# Patient Record
Sex: Female | Born: 1970 | Race: Black or African American | Hispanic: No | Marital: Single | Smoking: Current every day smoker
Health system: Southern US, Community
[De-identification: ages and names within clinical notes are randomized; demographics above are authoritative.]

## PROBLEM LIST (undated history)

## (undated) DIAGNOSIS — I10 Essential (primary) hypertension: Secondary | ICD-10-CM

## (undated) DIAGNOSIS — Z9289 Personal history of other medical treatment: Secondary | ICD-10-CM

## (undated) DIAGNOSIS — D649 Anemia, unspecified: Secondary | ICD-10-CM

## (undated) HISTORY — DX: Essential (primary) hypertension: I10

## (undated) HISTORY — PX: TUBAL LIGATION: SHX77

## (undated) HISTORY — PX: KNEE SURGERY: SHX244

---

## 2001-10-25 ENCOUNTER — Emergency Department (HOSPITAL_COMMUNITY): Admission: EM | Admit: 2001-10-25 | Discharge: 2001-10-25 | Payer: Self-pay | Admitting: Emergency Medicine

## 2002-07-14 ENCOUNTER — Emergency Department (HOSPITAL_COMMUNITY): Admission: EM | Admit: 2002-07-14 | Discharge: 2002-07-14 | Payer: Self-pay | Admitting: Emergency Medicine

## 2003-10-14 ENCOUNTER — Emergency Department (HOSPITAL_COMMUNITY): Admission: EM | Admit: 2003-10-14 | Discharge: 2003-10-14 | Payer: Self-pay | Admitting: Emergency Medicine

## 2003-10-22 ENCOUNTER — Emergency Department (HOSPITAL_COMMUNITY): Admission: EM | Admit: 2003-10-22 | Discharge: 2003-10-22 | Payer: Self-pay | Admitting: Emergency Medicine

## 2005-04-21 ENCOUNTER — Emergency Department (HOSPITAL_COMMUNITY): Admission: EM | Admit: 2005-04-21 | Discharge: 2005-04-21 | Payer: Self-pay | Admitting: Emergency Medicine

## 2005-05-01 ENCOUNTER — Ambulatory Visit: Payer: Self-pay | Admitting: Internal Medicine

## 2005-05-04 ENCOUNTER — Ambulatory Visit (HOSPITAL_COMMUNITY): Admission: RE | Admit: 2005-05-04 | Discharge: 2005-05-04 | Payer: Self-pay | Admitting: Internal Medicine

## 2005-05-08 ENCOUNTER — Ambulatory Visit (HOSPITAL_COMMUNITY): Admission: RE | Admit: 2005-05-08 | Discharge: 2005-05-08 | Payer: Self-pay | Admitting: Family Medicine

## 2005-09-20 ENCOUNTER — Emergency Department (HOSPITAL_COMMUNITY): Admission: EM | Admit: 2005-09-20 | Discharge: 2005-09-20 | Payer: Self-pay | Admitting: Emergency Medicine

## 2005-10-21 ENCOUNTER — Encounter: Admission: RE | Admit: 2005-10-21 | Discharge: 2006-01-19 | Payer: Self-pay | Admitting: Orthopaedic Surgery

## 2006-05-06 ENCOUNTER — Emergency Department (HOSPITAL_COMMUNITY): Admission: EM | Admit: 2006-05-06 | Discharge: 2006-05-06 | Payer: Self-pay | Admitting: Family Medicine

## 2006-06-24 ENCOUNTER — Emergency Department (HOSPITAL_COMMUNITY): Admission: EM | Admit: 2006-06-24 | Discharge: 2006-06-24 | Payer: Self-pay | Admitting: Family Medicine

## 2006-09-01 ENCOUNTER — Ambulatory Visit: Payer: Self-pay | Admitting: Nurse Practitioner

## 2007-07-02 ENCOUNTER — Emergency Department (HOSPITAL_COMMUNITY): Admission: EM | Admit: 2007-07-02 | Discharge: 2007-07-02 | Payer: Self-pay | Admitting: Family Medicine

## 2008-11-28 ENCOUNTER — Emergency Department (HOSPITAL_COMMUNITY): Admission: EM | Admit: 2008-11-28 | Discharge: 2008-11-28 | Payer: Self-pay | Admitting: Emergency Medicine

## 2010-04-14 ENCOUNTER — Emergency Department (HOSPITAL_COMMUNITY): Admission: EM | Admit: 2010-04-14 | Discharge: 2010-04-14 | Payer: Self-pay | Admitting: Emergency Medicine

## 2012-07-21 ENCOUNTER — Emergency Department (HOSPITAL_COMMUNITY)
Admission: EM | Admit: 2012-07-21 | Discharge: 2012-07-21 | Disposition: A | Discharge status: Home / Self Care | Payer: No Typology Code available for payment source | Attending: Emergency Medicine | Admitting: Emergency Medicine

## 2012-07-21 ENCOUNTER — Emergency Department (HOSPITAL_COMMUNITY): Payer: No Typology Code available for payment source

## 2012-07-21 ENCOUNTER — Encounter (HOSPITAL_COMMUNITY): Payer: Self-pay | Admitting: Emergency Medicine

## 2012-07-21 DIAGNOSIS — Y939 Activity, unspecified: Secondary | ICD-10-CM | POA: Insufficient documentation

## 2012-07-21 DIAGNOSIS — S99929A Unspecified injury of unspecified foot, initial encounter: Secondary | ICD-10-CM | POA: Insufficient documentation

## 2012-07-21 DIAGNOSIS — M25559 Pain in unspecified hip: Secondary | ICD-10-CM

## 2012-07-21 DIAGNOSIS — F17200 Nicotine dependence, unspecified, uncomplicated: Secondary | ICD-10-CM | POA: Insufficient documentation

## 2012-07-21 DIAGNOSIS — S79919A Unspecified injury of unspecified hip, initial encounter: Secondary | ICD-10-CM | POA: Insufficient documentation

## 2012-07-21 DIAGNOSIS — S8990XA Unspecified injury of unspecified lower leg, initial encounter: Secondary | ICD-10-CM | POA: Insufficient documentation

## 2012-07-21 DIAGNOSIS — Y92410 Unspecified street and highway as the place of occurrence of the external cause: Secondary | ICD-10-CM | POA: Insufficient documentation

## 2012-07-21 DIAGNOSIS — M25569 Pain in unspecified knee: Secondary | ICD-10-CM

## 2012-07-21 MED ORDER — IBUPROFEN 800 MG PO TABS: 800.0000 mg | ORAL_TABLET | Freq: Once | ORAL | Status: DC

## 2012-07-21 MED ORDER — IBUPROFEN 800 MG PO TABS
800.0000 mg | ORAL_TABLET | Freq: Once | ORAL | Status: AC
  Administered 2012-07-21: 800 mg via ORAL
  Filled 2012-07-21: qty 1

## 2012-07-21 MED ORDER — OXYCODONE-ACETAMINOPHEN 5-325 MG PO TABS
1.0000 | ORAL_TABLET | Freq: Once | ORAL | Status: AC
  Administered 2012-07-21: 1 via ORAL
  Filled 2012-07-21: qty 1

## 2012-07-21 MED ORDER — OXYCODONE-ACETAMINOPHEN 5-325 MG PO TABS: 1.0000 | ORAL_TABLET | Freq: Once | ORAL | Status: DC
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
## 2012-07-21 NOTE — ED Notes (Signed)
Pt return from RAD.

## 2012-07-21 NOTE — ED Provider Notes (Signed)
Medical screening examination/treatment/procedure(s) were performed by non-physician practitioner and as supervising physician I was immediately available for consultation/collaboration.   Laray Anger, DO 07/21/12 2204
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
## 2013-10-17 ENCOUNTER — Encounter (HOSPITAL_COMMUNITY): Payer: Self-pay | Admitting: Emergency Medicine

## 2013-10-17 ENCOUNTER — Emergency Department (HOSPITAL_COMMUNITY)
Admission: EM | Admit: 2013-10-17 | Discharge: 2013-10-17 | Disposition: A | Discharge status: Home / Self Care | Payer: No Typology Code available for payment source | Attending: Emergency Medicine | Admitting: Emergency Medicine

## 2013-10-17 DIAGNOSIS — S199XXA Unspecified injury of neck, initial encounter: Principal | ICD-10-CM

## 2013-10-17 DIAGNOSIS — S0993XA Unspecified injury of face, initial encounter: Secondary | ICD-10-CM | POA: Insufficient documentation

## 2013-10-17 DIAGNOSIS — S0990XA Unspecified injury of head, initial encounter: Secondary | ICD-10-CM | POA: Insufficient documentation

## 2013-10-17 DIAGNOSIS — Y9389 Activity, other specified: Secondary | ICD-10-CM | POA: Insufficient documentation

## 2013-10-17 DIAGNOSIS — F17200 Nicotine dependence, unspecified, uncomplicated: Secondary | ICD-10-CM | POA: Insufficient documentation

## 2013-10-17 DIAGNOSIS — Y92410 Unspecified street and highway as the place of occurrence of the external cause: Secondary | ICD-10-CM | POA: Insufficient documentation

## 2013-10-17 DIAGNOSIS — M62838 Other muscle spasm: Secondary | ICD-10-CM | POA: Insufficient documentation

## 2013-10-17 MED ORDER — IBUPROFEN 800 MG PO TABS: 800.0000 mg | ORAL_TABLET | Freq: Three times a day (TID) | ORAL | Status: DC

## 2013-10-17 MED ORDER — IBUPROFEN 400 MG PO TABS
800.0000 mg | ORAL_TABLET | Freq: Once | ORAL | Status: AC
  Administered 2013-10-17: 800 mg via ORAL
  Filled 2013-10-17: qty 2

## 2013-10-17 MED ORDER — METHOCARBAMOL 500 MG PO TABS
500.0000 mg | ORAL_TABLET | Freq: Once | ORAL | Status: AC
  Administered 2013-10-17: 500 mg via ORAL
  Filled 2013-10-17: qty 1

## 2013-10-17 MED ORDER — METHOCARBAMOL 500 MG PO TABS: 500.0000 mg | ORAL_TABLET | Freq: Three times a day (TID) | ORAL | Status: DC | PRN
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
## 2013-10-18 NOTE — ED Provider Notes (Signed)
Medical screening examination/treatment/procedure(s) were performed by non-physician practitioner and as supervising physician I was immediately available for consultation/collaboration.   EKG Interpretation None        Charles B. Karle Starch, MD 10/18/13 1019

## 2014-05-29 ENCOUNTER — Emergency Department (HOSPITAL_COMMUNITY)
Admission: EM | Admit: 2014-05-29 | Discharge: 2014-05-29 | Disposition: A | Discharge status: Home / Self Care | Payer: No Typology Code available for payment source | Attending: Emergency Medicine | Admitting: Emergency Medicine

## 2014-05-29 ENCOUNTER — Encounter (HOSPITAL_COMMUNITY): Payer: Self-pay | Admitting: Emergency Medicine

## 2014-05-29 DIAGNOSIS — H00026 Hordeolum internum left eye, unspecified eyelid: Secondary | ICD-10-CM

## 2014-05-29 DIAGNOSIS — R51 Headache: Secondary | ICD-10-CM | POA: Insufficient documentation

## 2014-05-29 DIAGNOSIS — H00024 Hordeolum internum left upper eyelid: Secondary | ICD-10-CM | POA: Insufficient documentation

## 2014-05-29 DIAGNOSIS — Z720 Tobacco use: Secondary | ICD-10-CM | POA: Insufficient documentation

## 2014-05-29 MED ORDER — TRAMADOL HCL 50 MG PO TABS: 50.0000 mg | ORAL_TABLET | Freq: Four times a day (QID) | ORAL | Status: DC | PRN

## 2014-05-29 MED ORDER — TRAMADOL HCL 50 MG PO TABS
50.0000 mg | ORAL_TABLET | Freq: Once | ORAL | Status: AC
  Administered 2014-05-29: 50 mg via ORAL
  Filled 2014-05-29: qty 1

## 2014-05-29 MED ORDER — ERYTHROMYCIN 5 MG/GM OP OINT: 1.0000 "application " | TOPICAL_OINTMENT | Freq: Two times a day (BID) | OPHTHALMIC | Status: DC

## 2014-05-29 MED ORDER — ERYTHROMYCIN 5 MG/GM OP OINT
1.0000 "application " | TOPICAL_OINTMENT | Freq: Once | OPHTHALMIC | Status: AC
  Administered 2014-05-29: 1 via OPHTHALMIC
  Filled 2014-05-29: qty 3.5

## 2014-05-29 NOTE — Discharge Instructions (Signed)
Use erythromycin ointment as directed until symptoms resolve. Refer to attached documents for more information.

## 2014-05-29 NOTE — ED Provider Notes (Signed)
Medical screening examination/treatment/procedure(s) were performed by non-physician practitioner and as supervising physician I was immediately available for consultation/collaboration.   EKG Interpretation None        Fredia Sorrow, MD 05/29/14 1737

## 2014-05-29 NOTE — ED Provider Notes (Signed)
CSN: [id number]     Arrival date & time 05/29/14  1002 History  This chart was scribed for non-physician practitioner, Alvina Chou, PA-C, working with Fredia Sorrow, MD, by Delphia Grates, ED Scribe. This patient was seen in room TR04C/TR04C and the patient's care was started at 10:40 AM.    Chief Complaint  Patient presents with  . Eye Pain    Patient is a 43 y.o. female presenting with eye pain. The history is provided by the patient. No language interpreter was used.  Eye Pain This is a new problem. The current episode started more than 2 days ago. The problem has not changed since onset.Associated symptoms include headaches. Nothing aggravates the symptoms. Nothing relieves the symptoms. She has tried nothing for the symptoms.   HPI Comments: Amanda Casey is a 43 y.o. female who presents to the Emergency Department complaining of constant, moderate left eye pain onset 5 days ago. There is associated HA, tearing, and swelling to left eyelid. She denies foreign bodies or injury. Patient has no history of significant health conditions.   History reviewed. No pertinent past medical history. History reviewed. No pertinent past surgical history. History reviewed. No pertinent family history. History  Substance Use Topics  . Smoking status: Current Every Day Smoker  . Smokeless tobacco: Not on file  . Alcohol Use: Yes   OB History   Grav Para Term Preterm Abortions TAB SAB Ect Mult Living                 Review of Systems  Eyes: Positive for pain and redness.  Neurological: Positive for headaches.      Allergies  Review of patient's allergies indicates no known allergies.  Home Medications   Prior to Admission medications   Medication Sig Start Date End Date Taking? Authorizing Provider  Aspirin-Salicylamide-Caffeine (BC HEADACHE POWDER PO) Take 1 packet by mouth daily as needed (headache).   Yes Historical Provider, MD   Triage Vitals: BP 98/61  Pulse 81   Temp(Src) 97.9 F (36.6 C) (Oral)  Resp 18  Ht 5\' 2"  (1.575 m)  Wt 140 lb (63.504 kg)  BMI 25.60 kg/m2  SpO2 100%  Physical Exam  Nursing note and vitals reviewed. Constitutional: She is oriented to person, place, and time. She appears well-developed and well-nourished. No distress.  HENT:  Head: Normocephalic and atraumatic.  Eyes: Conjunctivae and EOM are normal. Pupils are equal, round, and reactive to light. Right eye exhibits no discharge. Left eye exhibits no discharge.  left upper eyelid swelling and TTP. No drainage noted.  Neck: Neck supple. No tracheal deviation present.  Cardiovascular: Normal rate.   Pulmonary/Chest: Effort normal. No respiratory distress.  Musculoskeletal: Normal range of motion.  Neurological: She is alert and oriented to person, place, and time.  Skin: Skin is warm and dry.  Psychiatric: She has a normal mood and affect. Her behavior is normal.    ED Course  Procedures (including critical care time)  DIAGNOSTIC STUDIES: Oxygen Saturation is 100% on room air, normal by my interpretation.    COORDINATION OF CARE: At 9675 Discussed treatment plan with patient which includes pain medication and ABX ointment. Patient agrees.   Labs Review Labs Reviewed - No data to display  Imaging Review No results found.   EKG Interpretation None      MDM   Final diagnoses:  Hordeolum eyelid, internal, left    Patient will have erythromycin ointment for hordeolum. No visual changes or other symptoms.  I personally performed the services described in this documentation, which was scribed in my presence. The recorded information has been reviewed and is accurate.    Alvina Chou, PA-C 05/29/14 1311

## 2014-05-29 NOTE — ED Notes (Signed)
Pt c/o left eye pain x 3 days; pt sts some blurry vision but denies obvious injury

## 2017-10-06 ENCOUNTER — Encounter (HOSPITAL_COMMUNITY): Payer: Self-pay

## 2017-10-06 ENCOUNTER — Emergency Department (HOSPITAL_COMMUNITY): Payer: Self-pay

## 2017-10-06 ENCOUNTER — Other Ambulatory Visit: Payer: Self-pay

## 2017-10-06 ENCOUNTER — Observation Stay (HOSPITAL_COMMUNITY)
Admission: EM | Admit: 2017-10-06 | Discharge: 2017-10-07 | Disposition: A | Discharge status: Home / Self Care | Payer: Self-pay | Attending: Internal Medicine | Admitting: Internal Medicine

## 2017-10-06 ENCOUNTER — Other Ambulatory Visit (HOSPITAL_COMMUNITY): Payer: Self-pay

## 2017-10-06 DIAGNOSIS — R52 Pain, unspecified: Secondary | ICD-10-CM

## 2017-10-06 DIAGNOSIS — R102 Pelvic and perineal pain unspecified side: Secondary | ICD-10-CM

## 2017-10-06 DIAGNOSIS — Z87891 Personal history of nicotine dependence: Secondary | ICD-10-CM | POA: Insufficient documentation

## 2017-10-06 DIAGNOSIS — D509 Iron deficiency anemia, unspecified: Secondary | ICD-10-CM | POA: Diagnosis present

## 2017-10-06 DIAGNOSIS — N939 Abnormal uterine and vaginal bleeding, unspecified: Secondary | ICD-10-CM

## 2017-10-06 DIAGNOSIS — E876 Hypokalemia: Secondary | ICD-10-CM | POA: Insufficient documentation

## 2017-10-06 DIAGNOSIS — D500 Iron deficiency anemia secondary to blood loss (chronic): Secondary | ICD-10-CM | POA: Insufficient documentation

## 2017-10-06 DIAGNOSIS — D649 Anemia, unspecified: Secondary | ICD-10-CM

## 2017-10-06 DIAGNOSIS — N920 Excessive and frequent menstruation with regular cycle: Principal | ICD-10-CM | POA: Insufficient documentation

## 2017-10-06 DIAGNOSIS — D259 Leiomyoma of uterus, unspecified: Secondary | ICD-10-CM

## 2017-10-06 DIAGNOSIS — D251 Intramural leiomyoma of uterus: Secondary | ICD-10-CM | POA: Insufficient documentation

## 2017-10-06 LAB — CBC WITH DIFFERENTIAL/PLATELET
BASOS ABS: 0.1 10*3/uL (ref 0.0–0.1)
Basophils Relative: 1 %
EOS ABS: 0.1 10*3/uL (ref 0.0–0.7)
Eosinophils Relative: 1 %
HCT: 21.1 % — ABNORMAL LOW (ref 36.0–46.0)
HEMOGLOBIN: 5.3 g/dL — AB (ref 12.0–15.0)
LYMPHS PCT: 37 %
Lymphs Abs: 1.9 10*3/uL (ref 0.7–4.0)
MCH: 15.9 pg — ABNORMAL LOW (ref 26.0–34.0)
MCHC: 25.1 g/dL — AB (ref 30.0–36.0)
MCV: 63.4 fL — ABNORMAL LOW (ref 78.0–100.0)
MONO ABS: 0.9 10*3/uL (ref 0.1–1.0)
Monocytes Relative: 17 %
NEUTROS ABS: 2.1 10*3/uL (ref 1.7–7.7)
Neutrophils Relative %: 44 %
PLATELETS: 597 10*3/uL — AB (ref 150–400)
RBC: 3.33 MIL/uL — ABNORMAL LOW (ref 3.87–5.11)
RDW: 21.8 % — ABNORMAL HIGH (ref 11.5–15.5)
WBC: 5.1 10*3/uL (ref 4.0–10.5)

## 2017-10-06 LAB — BASIC METABOLIC PANEL
Anion gap: 9 (ref 5–15)
BUN: 6 mg/dL (ref 6–20)
CO2: 23 mmol/L (ref 22–32)
Calcium: 9.2 mg/dL (ref 8.9–10.3)
Chloride: 105 mmol/L (ref 101–111)
Creatinine, Ser: 0.58 mg/dL (ref 0.44–1.00)
GFR calc Af Amer: >60 mL/min (ref >60)
GLUCOSE: 97 mg/dL (ref 65–99)
Potassium: 4.1 mmol/L (ref 3.5–5.1)
Sodium: 137 mmol/L (ref 135–145)

## 2017-10-06 LAB — I-STAT BETA HCG BLOOD, ED (MC, WL, AP ONLY): I-stat hCG, quantitative: <5 m[IU]/mL (ref <5)

## 2017-10-06 LAB — CBG MONITORING, ED: Glucose-Capillary: 86 mg/dL (ref 65–99)

## 2017-10-06 LAB — WET PREP, GENITAL
Clue Cells Wet Prep HPF POC: NONE SEEN
Sperm: NONE SEEN
Trich, Wet Prep: NONE SEEN
Yeast Wet Prep HPF POC: NONE SEEN

## 2017-10-06 LAB — ABO/RH: ABO/RH(D): O POS

## 2017-10-06 LAB — PREPARE RBC (CROSSMATCH)

## 2017-10-06 MED ORDER — MEGESTROL ACETATE 40 MG PO TABS
120.0000 mg | ORAL_TABLET | Freq: Every day | ORAL | Status: DC
  Administered 2017-10-06: 40 mg via ORAL
  Administered 2017-10-07: 120 mg via ORAL
  Filled 2017-10-06 (×3): qty 3

## 2017-10-06 MED ORDER — ACETAMINOPHEN 650 MG RE SUPP: 650.0000 mg | Freq: Four times a day (QID) | RECTAL | Status: DC | PRN

## 2017-10-06 MED ORDER — ONDANSETRON HCL 4 MG/2ML IJ SOLN: 4.0000 mg | Freq: Four times a day (QID) | INTRAMUSCULAR | Status: DC | PRN

## 2017-10-06 MED ORDER — ESTROGENS CONJUGATED 25 MG IJ SOLR
25.0000 mg | Freq: Four times a day (QID) | INTRAMUSCULAR | Status: AC
  Administered 2017-10-06 – 2017-10-07 (×2): 25 mg via INTRAVENOUS
  Filled 2017-10-06 (×2): qty 25

## 2017-10-06 MED ORDER — ACETAMINOPHEN 325 MG PO TABS: 650.0000 mg | ORAL_TABLET | Freq: Four times a day (QID) | ORAL | Status: DC | PRN

## 2017-10-06 MED ORDER — SODIUM CHLORIDE 0.9 % IV SOLN
Freq: Once | INTRAVENOUS | Status: AC
  Administered 2017-10-06: 20:00:00 via INTRAVENOUS

## 2017-10-06 MED ORDER — SODIUM CHLORIDE 0.9 % IV SOLN: INTRAVENOUS | Status: DC

## 2017-10-06 MED ORDER — ONDANSETRON HCL 4 MG PO TABS: 4.0000 mg | ORAL_TABLET | Freq: Four times a day (QID) | ORAL | Status: DC | PRN

## 2017-10-06 NOTE — ED Notes (Signed)
Returned from ultrasound , pelvic exam in progress.

## 2017-10-06 NOTE — ED Notes (Signed)
Patient currently at ultrasound .  

## 2017-10-06 NOTE — H&P (Signed)
History and Physical    [name] WPY:[id number] DOB: Aug 10, 1971 DOA: 10/06/2017  PCP: Patient, No Pcp Per  Patient coming from: Home.  Chief Complaint: Fatigue and weakness.  HPI: Amanda Casey is a 47 y.o. female with history of tobacco abuse presents to the ER after patient has been having fatigue.  Patient has these symptoms every time she gets menstrual cycle which has become more heavy and longer durations over the last 1 year.  Also has worsening cramping abdominal pain for which she takes ibuprofen during the menstrual period.  Patient also had a brief episode of syncope last evening while at home sitting on the bed.  Denies any blood in the stools nausea vomiting or diarrhea.  ED Course: In the ER patient's hemoglobin is found to be around 5.  Blood which is shows microcytic hypochromic.  Pelvic ultrasound shows uterine fibroids.  Dr. Elonda Husky on-call gynecologist was consulted by ER physician who recommended to dose of Premarin IV followed by Megace and follow-up with gynecology as outpatient after transfusion.  Review of Systems: As per HPI, rest all negative.   History reviewed. No pertinent past medical history.  History reviewed. No pertinent surgical history.   reports that she has been smoking.  she has never used smokeless tobacco. She reports that she drinks alcohol. She reports that she does not use drugs.  No Known Allergies  Family History  Problem Relation Age of Onset  . Diabetes Mellitus II Mother   . Hypertension Mother     Prior to Admission medications   Medication Sig Start Date End Date Taking? Authorizing Provider  Aspirin-Salicylamide-Caffeine (BC HEADACHE POWDER PO) Take 1 packet by mouth daily as needed (headache).   Yes [provider]  dextromethorphan-guaiFENesin (MUCINEX DM) 30-600 MG 12hr tablet Take 1 tablet by mouth 2 (two) times daily as needed for cough.   Yes [provider]  guaiFENesin (ROBITUSSIN) 100 MG/5ML  SOLN Take 5 mLs by mouth every 4 (four) hours as needed for cough or to loosen phlegm.   Yes [provider]  Phenylephrine-DM-GG-APAP (TYLENOL COLD/FLU SEVERE PO) Take 2 tablets by mouth daily as needed (cold symptoms).   Yes [provider]    Physical Exam: Vitals:   10/06/17 2130 10/06/17 2145 10/06/17 2200 10/06/17 2205  BP: 125/77 119/73 109/72 108/88  Pulse: 75 78 77 77  Resp: 12 12 12 16   Temp:    98.8 F (37.1 C)  TempSrc:      SpO2: 100% 100% 100% 99%  Weight:      Height:          Constitutional: Moderately built and nourished. Vitals:   10/06/17 2130 10/06/17 2145 10/06/17 2200 10/06/17 2205  BP: 125/77 119/73 109/72 108/88  Pulse: 75 78 77 77  Resp: 12 12 12 16   Temp:    98.8 F (37.1 C)  TempSrc:      SpO2: 100% 100% 100% 99%  Weight:      Height:       Eyes: Anicteric mild pallor. ENMT: No discharge from the ears eyes nose or mouth. Neck: No mass felt.  No neck rigidity. Respiratory: No rhonchi or crepitations. Cardiovascular: S1-S2 heard no murmurs appreciated. Abdomen: Soft nontender bowel sounds present. Musculoskeletal: No edema.  No joint effusion. Skin: No rash.  Skin appears warm. Neurologic: Alert awake oriented to time place and person.  Moves all extremities. Psychiatric: Appears normal.  Normal affect.   Labs on Admission: I have personally  reviewed following labs and imaging studies  CBC: Recent Labs  Lab 10/06/17 1617  WBC 5.1  NEUTROABS 2.1  HGB 5.3*  HCT 21.1*  MCV 63.4*  PLT 644*   Basic Metabolic Panel: Recent Labs  Lab 10/06/17 1617  NA 137  K 4.1  CL 105  CO2 23  GLUCOSE 97  BUN 6  CREATININE 0.58  CALCIUM 9.2   GFR: Estimated Creatinine Clearance: 71.9 mL/min (by C-G formula based on SCr of 0.58 mg/dL). Liver Function Tests: No results for input(s): AST, ALT, ALKPHOS, BILITOT, PROT, ALBUMIN in the last 168 hours. No results for input(s): LIPASE, AMYLASE in the last 168 hours. No results  for input(s): AMMONIA in the last 168 hours. Coagulation Profile: No results for input(s): INR, PROTIME in the last 168 hours. Cardiac Enzymes: No results for input(s): CKTOTAL, CKMB, CKMBINDEX, TROPONINI in the last 168 hours. BNP (last 3 results) No results for input(s): PROBNP in the last 8760 hours. HbA1C: No results for input(s): HGBA1C in the last 72 hours. CBG: Recent Labs  Lab 10/06/17 1556  GLUCAP 86   Lipid Profile: No results for input(s): CHOL, HDL, LDLCALC, TRIG, CHOLHDL, LDLDIRECT in the last 72 hours. Thyroid Function Tests: No results for input(s): TSH, T4TOTAL, FREET4, T3FREE, THYROIDAB in the last 72 hours. Anemia Panel: No results for input(s): VITAMINB12, FOLATE, FERRITIN, TIBC, IRON, RETICCTPCT in the last 72 hours. Urine analysis: No results found for: COLORURINE, APPEARANCEUR, LABSPEC, PHURINE, GLUCOSEU, HGBUR, BILIRUBINUR, KETONESUR, PROTEINUR, UROBILINOGEN, NITRITE, LEUKOCYTESUR Sepsis Labs: @LABRCNTIP (procalcitonin:4,lacticidven:4) ) Recent Results (from the past 240 hour(s))  Wet prep, genital     Status: Abnormal   Collection Time: 10/06/17  8:14 PM  Result Value Ref Range Status   Yeast Wet Prep HPF POC NONE SEEN NONE SEEN Final   Trich, Wet Prep NONE SEEN NONE SEEN Final   Clue Cells Wet Prep HPF POC NONE SEEN NONE SEEN Final   WBC, Wet Prep HPF POC FEW (A) NONE SEEN Final   Sperm NONE SEEN  Final    Comment: Performed at [facility], [address], [place], [place] [address]     Radiological Exams on Admission: Dg Chest 2 View  Result Date: 10/06/2017 CLINICAL DATA:  Dizziness with cough EXAM: CHEST  2 VIEW COMPARISON:  None. FINDINGS: Lungs are clear. Heart size and pulmonary vascularity are normal. No adenopathy. No pneumothorax. No bone lesions. There is a small azygos lobe on the right, an anatomic variant. IMPRESSION: No edema or consolidation. Electronically Signed   By: Lowella Grip III M.D.   On: 10/06/2017 18:17   US  Pelvic Doppler (torsion R/o Or Mass Arterial Flow)  Result Date: 10/06/2017 CLINICAL DATA:  Initial evaluation for acute vaginal bleeding. EXAM: TRANSABDOMINAL ULTRASOUND OF PELVIS DOPPLER ULTRASOUND OF OVARIES TECHNIQUE: Transabdominal ultrasound examination of the pelvis was performed including evaluation of the uterus, ovaries, adnexal regions, and pelvic cul-de-sac. Color and duplex Doppler ultrasound was utilized to evaluate blood flow to the ovaries. COMPARISON:  None. FINDINGS: Uterus Measurements: 15.5 x 9.5 x 12.0 cm. Multiple prominent fibroids are seen throughout the uterus. 4.5 x 5.0 x 4.3 cm intramural fibroid present at the uterine fundus. 5.0 x 6.6 x 5.7 cm intramural fibroid present at the left uterine body. 6.5 x 6.7 x 6.2 cm intramural fibroid present at the right mid uterine body. 4.5 x 6.2 x 6.6 cm intramural fibroid present within the lower right uterine body. Endometrium Thickness: 7 mm.  No focal abnormality visualized. Right ovary  Measurements: 2.7 x 1.0 x 2.0 cm. Normal appearance/no adnexal mass. Left ovary Measurements: 4.2 x 1.3 x 2.7 cm. Normal appearance/no adnexal mass. Pulsed Doppler evaluation demonstrates normal low-resistance arterial and venous waveforms in the left ovary. Venous waveform seen within the right ovary, with a discrete arterial waveform poorly visualized, likely due to technique. No secondary signs to suggest torsion identified. IMPRESSION: 1. Enlarged fibroid uterus as above. 2. No other acute abnormality within the pelvis. Normal sonographic appearance of the ovaries. No convincing sonographic evidence for ovarian torsion with limitations as above. Electronically Signed   By: Jeannine Boga M.D.   On: 10/06/2017 19:56   US Pelvic Complete W Transvaginal And Torsion R/o  Result Date: 10/06/2017 CLINICAL DATA:  Initial evaluation for acute vaginal bleeding. EXAM: TRANSABDOMINAL ULTRASOUND OF PELVIS DOPPLER ULTRASOUND OF OVARIES TECHNIQUE: Transabdominal  ultrasound examination of the pelvis was performed including evaluation of the uterus, ovaries, adnexal regions, and pelvic cul-de-sac. Color and duplex Doppler ultrasound was utilized to evaluate blood flow to the ovaries. COMPARISON:  None. FINDINGS: Uterus Measurements: 15.5 x 9.5 x 12.0 cm. Multiple prominent fibroids are seen throughout the uterus. 4.5 x 5.0 x 4.3 cm intramural fibroid present at the uterine fundus. 5.0 x 6.6 x 5.7 cm intramural fibroid present at the left uterine body. 6.5 x 6.7 x 6.2 cm intramural fibroid present at the right mid uterine body. 4.5 x 6.2 x 6.6 cm intramural fibroid present within the lower right uterine body. Endometrium Thickness: 7 mm.  No focal abnormality visualized. Right ovary Measurements: 2.7 x 1.0 x 2.0 cm. Normal appearance/no adnexal mass. Left ovary Measurements: 4.2 x 1.3 x 2.7 cm. Normal appearance/no adnexal mass. Pulsed Doppler evaluation demonstrates normal low-resistance arterial and venous waveforms in the left ovary. Venous waveform seen within the right ovary, with a discrete arterial waveform poorly visualized, likely due to technique. No secondary signs to suggest torsion identified. IMPRESSION: 1. Enlarged fibroid uterus as above. 2. No other acute abnormality within the pelvis. Normal sonographic appearance of the ovaries. No convincing sonographic evidence for ovarian torsion with limitations as above. Electronically Signed   By: Jeannine Boga M.D.   On: 10/06/2017 19:56    EKG: Independently reviewed.  Normal sinus rhythm with nonspecific ST-T changes.  Assessment/Plan Principal Problem:   Symptomatic anemia Active Problems:   Microcytic hypochromic anemia    1. Symptomatic anemia likely from severe menorrhagia with uterine fibroids -on-call gynecologist Dr. Elonda Husky advised patient to get to dose of Premarin IV followed by Megace daily and follow-up with gynecologist as outpatient after transfusion.  2 units of PRBC has been ordered.   Patient blood work which does show microcytic hypochromic picture.  Probably will need iron replacement.  Will need to check anemia panel as outpatient since patient is already receiving PRBC.  Patient's anemia is likely from menorrhagia.  Denies any blood in the stools or vomiting of blood. 2. Tobacco abuse -tobacco cessation counseling requested.  3. Syncope likely from #1.   DVT prophylaxis: SCDs. Code Status: Full code. Family Communication: Discussed with patient. Disposition Plan: Home. Consults called: ER physician discussed with gynecologist. Admission status: Observation.   Rise Patience MD [facility] Pager [phone].  If 7PM-7AM, please contact night-coverage [email or web address] Password [facility]  10/06/2017, 10:21 PM

## 2017-10-06 NOTE — ED Triage Notes (Signed)
Presents today with dizziness; states it has been happening off and on for past two months, particuarly with menstration- says she passes large clots and feels dizzy before passing clot.  Pt additionally states for past two weeks she has been taking OTC cold medicines (mucinex dm, robutussin, and "cold flu tablets") and states she has since developed the rash on her stomach.

## 2017-10-06 NOTE — ED Notes (Signed)
2nd unit PRBC completed with no adverse effect , VSS , denies pain , respirations unlabored , IV sites intact , NS IV infusing 173ml/hr.

## 2017-10-06 NOTE — ED Notes (Signed)
2nd unit PRBC infusing at 120 ml/hr, IV sites intact , VSS, respirations unlabored , denies pain /no adverse reaction , RN at bedside while transfusing.

## 2017-10-06 NOTE — ED Notes (Signed)
2nd unit PRBC infusing with no adverse effect , respirations unlabored ,VSS , denies pain , IV sites intact , family at bedside .

## 2017-10-06 NOTE — ED Notes (Signed)
1st unit PRBC completed with no adverse reaction , respirations unlabored , IV sites intact , denies pain .

## 2017-10-06 NOTE — ED Notes (Signed)
1st unit PRBC infusing at 261ml/hr , respirations unlabored , VSS , no pain or adverse reaction noted , IV sites intact .

## 2017-10-06 NOTE — ED Provider Notes (Signed)
Patient placed in Quick Look pathway, seen and evaluated   Chief Complaint: heavy vaginal bleeding, dizzy, pelvic pain  HPI:  Patient reports irregular periods for the past 6 months and that they have gotten heavier and more painful. Patient states that she had a period this month that lasted 10 days and then stopped for one day and now has started again and passing clots. Patient reports using 2 large pads per hour for the past 2 days. She c/o feeling dizzy and tired. Patient states that she does not have a PCP or GYN at this time. She did [facility]   ROS: abdomen: pain  GU: vaginal bleeding  Physical Exam:  BP 112/69 (BP Location: Right Arm)   Pulse 92   Temp 98.1 F (36.7 C)   Resp 16   Ht 5\' 3"  (1.6 m)   Wt 59 kg (130 lb)   SpO2 100%   BMI 23.03 kg/m    Gen: No distress  Neuro: Awake and Alert  Skin: Warm  Abdomen: soft, tender with palpation of the lower  abdomen.    Focused Exam:    Initiation of care has begun. The patient has been counseled on the process, plan, and necessity for staying for the completion/evaluation, and the remainder of the medical screening examination    Ashley Murrain, NP 10/06/17 1615    Tanna Furry, MD 10/07/17 2207

## 2017-10-06 NOTE — ED Notes (Signed)
Critical Hgb of 5.3 noted, will upgrade acuity and prioritize rooming for patient

## 2017-10-06 NOTE — ED Notes (Signed)
Patient transported to Ultrasound 

## 2017-10-06 NOTE — ED Notes (Signed)
Pharmacist notified on pt.'s Premarin/Megace order.

## 2017-10-06 NOTE — ED Provider Notes (Signed)
[place] EMERGENCY DEPARTMENT Provider Note   CSN: [id number] Arrival date & time: 10/06/17  1540     History   Chief Complaint No chief complaint on file.   HPI Amanda Casey is a 47 y.o. female who is previously healthy who presents with lightheadedness and  heavy vaginal bleeding.  Patient reports she has had increasing duration and heaviness of her periods over the past 6 months.  She states that has gotten heavier with large clots and more painful with cramping.  She reports that this month, her period lasted 10 days and stop for 1 day and started again last evening.  She reports using 2 large pads an hour for the past 2 days.  Reports feeling generally fatigued and lightheaded, like she is going to pass out.  She does not have an OB/GYN.  She did have a Pap smear of the health department over 1 year ago, but no follow-up since then.  She has no history of anemia or blood transfusions.  She denies any chest pain, shortness of breath, nausea, vomiting, abnormal vaginal discharge other than bleeding, or urinary symptoms.  HPI  History reviewed. No pertinent past medical history.  Patient Active Problem List   Diagnosis Date Noted  . Symptomatic anemia 10/06/2017    History reviewed. No pertinent surgical history.  OB History    No data available       Home Medications    Prior to Admission medications   Medication Sig Start Date End Date Taking? Authorizing Provider  Aspirin-Salicylamide-Caffeine (BC HEADACHE POWDER PO) Take 1 packet by mouth daily as needed (headache).   Yes [provider]  dextromethorphan-guaiFENesin (MUCINEX DM) 30-600 MG 12hr tablet Take 1 tablet by mouth 2 (two) times daily as needed for cough.   Yes [provider]  guaiFENesin (ROBITUSSIN) 100 MG/5ML SOLN Take 5 mLs by mouth every 4 (four) hours as needed for cough or to loosen phlegm.   Yes [provider]  Phenylephrine-DM-GG-APAP (TYLENOL  COLD/FLU SEVERE PO) Take 2 tablets by mouth daily as needed (cold symptoms).   Yes [provider]    Family History History reviewed. No pertinent family history.  Social History Social History   Tobacco Use  . Smoking status: Current Every Day Smoker  Substance Use Topics  . Alcohol use: Yes  . Drug use: No     Allergies   Patient has no known allergies.   Review of Systems Review of Systems  Constitutional: Negative for chills and fever.  HENT: Negative for facial swelling and sore throat.   Respiratory: Negative for shortness of breath.   Cardiovascular: Negative for chest pain.  Gastrointestinal: Negative for abdominal pain, nausea and vomiting.  Genitourinary: Positive for pelvic pain (cramping) and vaginal bleeding. Negative for dysuria and vaginal discharge.  Musculoskeletal: Negative for back pain.  Skin: Negative for rash and wound.  Neurological: Positive for light-headedness. Negative for headaches.  Psychiatric/Behavioral: The patient is not nervous/anxious.      Physical Exam Updated Vital Signs BP (!) 113/56   Pulse 81   Temp 97.8 F (36.6 C) (Oral)   Resp 12   Ht 5\' 3"  (1.6 m)   Wt 59 kg (130 lb)   SpO2 100%   BMI 23.03 kg/m   Physical Exam  Constitutional: She appears well-developed and well-nourished. No distress.  HENT:  Head: Normocephalic and atraumatic.  Mouth/Throat: Oropharynx is clear and moist. No oropharyngeal exudate.  Pale conjunctiva  Eyes: Conjunctivae are  normal. Pupils are equal, round, and reactive to light. Right eye exhibits no discharge. Left eye exhibits no discharge. No scleral icterus.  Neck: Normal range of motion. Neck supple. No thyromegaly present.  Cardiovascular: Normal rate, regular rhythm, normal heart sounds and intact distal pulses. Exam reveals no gallop and no friction rub.  No murmur heard. Pulmonary/Chest: Effort normal and breath sounds normal. No stridor. No respiratory distress. She has no  wheezes. She has no rales.  Abdominal: Soft. Bowel sounds are normal. She exhibits no distension. There is tenderness in the suprapubic area. There is no rebound and no guarding.  Musculoskeletal: She exhibits no edema.  Lymphadenopathy:    She has no cervical adenopathy.  Neurological: She is alert. Coordination normal.  Skin: Skin is warm and dry. No rash noted. She is not diaphoretic. No pallor.  Psychiatric: She has a normal mood and affect.  Nursing note and vitals reviewed.    ED Treatments / Results  Labs (all labs ordered are listed, but only abnormal results are displayed) Labs Reviewed  WET PREP, GENITAL - Abnormal; Notable for the following components:      Result Value   WBC, Wet Prep HPF POC FEW (*)    All other components within normal limits  CBC WITH DIFFERENTIAL/PLATELET - Abnormal; Notable for the following components:   RBC 3.33 (*)    Hemoglobin 5.3 (*)    HCT 21.1 (*)    MCV 63.4 (*)    MCH 15.9 (*)    MCHC 25.1 (*)    RDW 21.8 (*)    Platelets 597 (*)    All other components within normal limits  BASIC METABOLIC PANEL  RPR  HIV ANTIBODY (ROUTINE TESTING)  URINALYSIS, ROUTINE W REFLEX MICROSCOPIC  CBG MONITORING, ED  I-STAT BETA HCG BLOOD, ED ([facility], [facility], [facility] ONLY)  TYPE AND SCREEN  PREPARE RBC (CROSSMATCH)  ABO/RH  GC/CHLAMYDIA PROBE AMP ([place]) NOT AT [facility] [place]    EKG  EKG Interpretation  Date/Time:  Wednesday October 06 2017 15:46:05 EST Ventricular Rate:  86 PR Interval:  144 QRS Duration: 86 QT Interval:  364 QTC Calculation: 435 R Axis:   52 Text Interpretation:  Normal sinus rhythm ST & T wave abnormality, consider inferior ischemia Abnormal ECG No old tracing to compare Confirmed by Deno Etienne [phone]) on 10/06/2017 5:48:22 PM       Radiology Dg Chest 2 View  Result Date: 10/06/2017 CLINICAL DATA:  Dizziness with cough EXAM: CHEST  2 VIEW COMPARISON:  None. FINDINGS: Lungs are clear. Heart size and pulmonary vascularity are normal.  No adenopathy. No pneumothorax. No bone lesions. There is a small azygos lobe on the right, an anatomic variant. IMPRESSION: No edema or consolidation. Electronically Signed   By: Lowella Grip III M.D.   On: 10/06/2017 18:17   US Pelvic Doppler (torsion R/o Or Mass Arterial Flow)  Result Date: 10/06/2017 CLINICAL DATA:  Initial evaluation for acute vaginal bleeding. EXAM: TRANSABDOMINAL ULTRASOUND OF PELVIS DOPPLER ULTRASOUND OF OVARIES TECHNIQUE: Transabdominal ultrasound examination of the pelvis was performed including evaluation of the uterus, ovaries, adnexal regions, and pelvic cul-de-sac. Color and duplex Doppler ultrasound was utilized to evaluate blood flow to the ovaries. COMPARISON:  None. FINDINGS: Uterus Measurements: 15.5 x 9.5 x 12.0 cm. Multiple prominent fibroids are seen throughout the uterus. 4.5 x 5.0 x 4.3 cm intramural fibroid present at the uterine fundus. 5.0 x 6.6 x 5.7 cm intramural fibroid present at the left uterine body. 6.5 x 6.7 x  6.2 cm intramural fibroid present at the right mid uterine body. 4.5 x 6.2 x 6.6 cm intramural fibroid present within the lower right uterine body. Endometrium Thickness: 7 mm.  No focal abnormality visualized. Right ovary Measurements: 2.7 x 1.0 x 2.0 cm. Normal appearance/no adnexal mass. Left ovary Measurements: 4.2 x 1.3 x 2.7 cm. Normal appearance/no adnexal mass. Pulsed Doppler evaluation demonstrates normal low-resistance arterial and venous waveforms in the left ovary. Venous waveform seen within the right ovary, with a discrete arterial waveform poorly visualized, likely due to technique. No secondary signs to suggest torsion identified. IMPRESSION: 1. Enlarged fibroid uterus as above. 2. No other acute abnormality within the pelvis. Normal sonographic appearance of the ovaries. No convincing sonographic evidence for ovarian torsion with limitations as above. Electronically Signed   By: Jeannine Boga M.D.   On: 10/06/2017 19:56   US  Pelvic Complete W Transvaginal And Torsion R/o  Result Date: 10/06/2017 CLINICAL DATA:  Initial evaluation for acute vaginal bleeding. EXAM: TRANSABDOMINAL ULTRASOUND OF PELVIS DOPPLER ULTRASOUND OF OVARIES TECHNIQUE: Transabdominal ultrasound examination of the pelvis was performed including evaluation of the uterus, ovaries, adnexal regions, and pelvic cul-de-sac. Color and duplex Doppler ultrasound was utilized to evaluate blood flow to the ovaries. COMPARISON:  None. FINDINGS: Uterus Measurements: 15.5 x 9.5 x 12.0 cm. Multiple prominent fibroids are seen throughout the uterus. 4.5 x 5.0 x 4.3 cm intramural fibroid present at the uterine fundus. 5.0 x 6.6 x 5.7 cm intramural fibroid present at the left uterine body. 6.5 x 6.7 x 6.2 cm intramural fibroid present at the right mid uterine body. 4.5 x 6.2 x 6.6 cm intramural fibroid present within the lower right uterine body. Endometrium Thickness: 7 mm.  No focal abnormality visualized. Right ovary Measurements: 2.7 x 1.0 x 2.0 cm. Normal appearance/no adnexal mass. Left ovary Measurements: 4.2 x 1.3 x 2.7 cm. Normal appearance/no adnexal mass. Pulsed Doppler evaluation demonstrates normal low-resistance arterial and venous waveforms in the left ovary. Venous waveform seen within the right ovary, with a discrete arterial waveform poorly visualized, likely due to technique. No secondary signs to suggest torsion identified. IMPRESSION: 1. Enlarged fibroid uterus as above. 2. No other acute abnormality within the pelvis. Normal sonographic appearance of the ovaries. No convincing sonographic evidence for ovarian torsion with limitations as above. Electronically Signed   By: Jeannine Boga M.D.   On: 10/06/2017 19:56    Procedures Procedures (including critical care time)  CRITICAL CARE Performed by: Frederica Kuster   Total critical care time: 30 minutes  Critical care time was exclusive of separately billable procedures and treating other  patients.  Critical care was necessary to treat or prevent imminent or life-threatening deterioration.  Critical care was time spent personally by me on the following activities: development of treatment plan with patient and/or surrogate as well as nursing, discussions with consultants, evaluation of patient's response to treatment, examination of patient, obtaining history from patient or surrogate, ordering and performing treatments and interventions, ordering and review of laboratory studies, ordering and review of radiographic studies, pulse oximetry and re-evaluation of patient's condition.   Medications Ordered in ED Medications  conjugated estrogens (PREMARIN) injection 25 mg (not administered)  megestrol (MEGACE) tablet 120 mg (not administered)  0.9 %  sodium chloride infusion ( Intravenous New Bag/Given 10/06/17 2027)     Initial Impression / Assessment and Plan / ED Course  I have reviewed the triage vital signs and the nursing notes.  Pertinent labs & imaging results that  were available during my care of the patient were reviewed by me and considered in my medical decision making (see chart for details).  Clinical Course as of Oct 06 2136  Wed Oct 06, 2017  2025 I spoke with Dr. Elonda Husky, OB/GYN, who advised 25 mg dose of Premarin IV with repeat in 6 hours.  He also advised 120 mg dose of Megace PO now and daily until seen in outpatient setting.  He advised follow-up at [facility] outpatient clinic within 2 weeks.  [AL]    Clinical Course User Index [AL] Frederica Kuster, PA-C    Patient with symptomatic anemia from increased vaginal bleeding, suspected to be from uterine fibroids seen on pelvic ultrasound today.  Hemoglobin 5.3.  BMP unremarkable.  I spoke with OB/GYN consultant, Dr. Elonda Husky.  His recommendations are above.  Potential hysterectomy to be scheduled from outpatient clinic.  Blood transfusion initiated in the ED.  Despite coughing for 1 week, chest x-ray is negative.   Suspect viral URI.  I consulted Dr. Hal Hope with [facility] who will admit the patient for further management.  Appreciate his assistance.  Patient vitals stable throughout ED course.  Final Clinical Impressions(s) / ED Diagnoses   Final diagnoses:  Pelvic pain  Pain  Symptomatic anemia  Vaginal bleeding  Uterine leiomyoma, unspecified location    ED Discharge Orders    None       Frederica Kuster, Hershal Coria 10/06/17 2138    Deno Etienne, DO 10/06/17 2204

## 2017-10-06 NOTE — ED Notes (Signed)
Patient signed consent form for blood transfusion . 

## 2017-10-06 NOTE — ED Notes (Signed)
2nd unit PRBC infusing at 250 ml/hr , no adverse reaction noted , respirations unlabored , VSS, denies pain , IV sites intact .

## 2017-10-06 NOTE — ED Notes (Signed)
1st unit PRBC started at 120 ml/hr , IV site intact , respirations unlabored ,VSS , denies pain , RN with pt. at bedside .

## 2017-10-07 DIAGNOSIS — D259 Leiomyoma of uterus, unspecified: Secondary | ICD-10-CM

## 2017-10-07 DIAGNOSIS — D649 Anemia, unspecified: Secondary | ICD-10-CM

## 2017-10-07 DIAGNOSIS — R102 Pelvic and perineal pain: Secondary | ICD-10-CM

## 2017-10-07 DIAGNOSIS — D509 Iron deficiency anemia, unspecified: Secondary | ICD-10-CM

## 2017-10-07 DIAGNOSIS — R52 Pain, unspecified: Secondary | ICD-10-CM

## 2017-10-07 LAB — BASIC METABOLIC PANEL
Anion gap: 11 (ref 5–15)
BUN: 7 mg/dL (ref 6–20)
CALCIUM: 8.5 mg/dL — AB (ref 8.9–10.3)
CO2: 20 mmol/L — AB (ref 22–32)
Chloride: 106 mmol/L (ref 101–111)
Creatinine, Ser: 0.57 mg/dL (ref 0.44–1.00)
GFR calc non Af Amer: >60 mL/min (ref >60)
GLUCOSE: 95 mg/dL (ref 65–99)
Potassium: 3.3 mmol/L — ABNORMAL LOW (ref 3.5–5.1)
Sodium: 137 mmol/L (ref 135–145)

## 2017-10-07 LAB — CBC WITH DIFFERENTIAL/PLATELET
BASOS ABS: 0 10*3/uL (ref 0.0–0.1)
Basophils Relative: 0 %
EOS ABS: 0.1 10*3/uL (ref 0.0–0.7)
Eosinophils Relative: 2 %
HCT: 29.2 % — ABNORMAL LOW (ref 36.0–46.0)
HEMOGLOBIN: 8.6 g/dL — AB (ref 12.0–15.0)
LYMPHS ABS: 1.8 10*3/uL (ref 0.7–4.0)
LYMPHS PCT: 25 %
MCH: 21.1 pg — ABNORMAL LOW (ref 26.0–34.0)
MCHC: 29.5 g/dL — AB (ref 30.0–36.0)
MCV: 71.7 fL — ABNORMAL LOW (ref 78.0–100.0)
Monocytes Absolute: 0.8 10*3/uL (ref 0.1–1.0)
Monocytes Relative: 12 %
NEUTROS ABS: 4.3 10*3/uL (ref 1.7–7.7)
Neutrophils Relative %: 61 %
Platelets: 377 10*3/uL (ref 150–400)
RBC: 4.07 MIL/uL (ref 3.87–5.11)
RDW: 25.3 % — AB (ref 11.5–15.5)
WBC: 7 10*3/uL (ref 4.0–10.5)

## 2017-10-07 LAB — CBC
HCT: 27.4 % — ABNORMAL LOW (ref 36.0–46.0)
Hemoglobin: 7.9 g/dL — ABNORMAL LOW (ref 12.0–15.0)
MCH: 20.5 pg — AB (ref 26.0–34.0)
MCHC: 28.8 g/dL — AB (ref 30.0–36.0)
MCV: 71 fL — ABNORMAL LOW (ref 78.0–100.0)
Platelets: 416 10*3/uL — ABNORMAL HIGH (ref 150–400)
RBC: 3.86 MIL/uL — AB (ref 3.87–5.11)
RDW: 26.6 % — ABNORMAL HIGH (ref 11.5–15.5)
WBC: 8.1 10*3/uL (ref 4.0–10.5)

## 2017-10-07 LAB — URINALYSIS, ROUTINE W REFLEX MICROSCOPIC
BILIRUBIN URINE: NEGATIVE
GLUCOSE, UA: NEGATIVE mg/dL
Ketones, ur: 5 mg/dL — AB
LEUKOCYTES UA: NEGATIVE
NITRITE: POSITIVE — AB
PH: 5 (ref 5.0–8.0)
Protein, ur: NEGATIVE mg/dL
SPECIFIC GRAVITY, URINE: 1.017 (ref 1.005–1.030)

## 2017-10-07 LAB — PREPARE RBC (CROSSMATCH)

## 2017-10-07 LAB — GC/CHLAMYDIA PROBE AMP (~~LOC~~) NOT AT ARMC
Chlamydia: NEGATIVE
Neisseria Gonorrhea: NEGATIVE

## 2017-10-07 LAB — HIV ANTIBODY (ROUTINE TESTING W REFLEX): HIV Screen 4th Generation wRfx: NONREACTIVE

## 2017-10-07 LAB — RPR: RPR Ser Ql: NONREACTIVE

## 2017-10-07 MED ORDER — SODIUM CHLORIDE 0.9 % IV SOLN: Freq: Once | INTRAVENOUS | Status: DC

## 2017-10-07 MED ORDER — POLYSACCHARIDE IRON COMPLEX 150 MG PO CAPS: 150.0000 mg | ORAL_CAPSULE | Freq: Every day | ORAL | 4 refills | Status: DC

## 2017-10-07 MED ORDER — SODIUM CHLORIDE 0.9 % IV SOLN
510.0000 mg | Freq: Once | INTRAVENOUS | Status: AC
  Administered 2017-10-07: 510 mg via INTRAVENOUS
  Filled 2017-10-07: qty 17

## 2017-10-07 MED ORDER — MEGESTROL ACETATE 40 MG PO TABS: 120.0000 mg | ORAL_TABLET | Freq: Every day | ORAL | 0 refills | Status: DC

## 2017-10-07 MED ORDER — POTASSIUM CHLORIDE CRYS ER 20 MEQ PO TBCR
40.0000 meq | EXTENDED_RELEASE_TABLET | Freq: Once | ORAL | Status: AC
  Administered 2017-10-07: 40 meq via ORAL
  Filled 2017-10-07: qty 2

## 2017-10-07 NOTE — ED Notes (Signed)
3rd unit PRBC infusing at 120 ml/hr , respirations unlabored / denies pain , VSS , IV sites intact , RN at bedside when transfusion started .

## 2017-10-07 NOTE — ED Notes (Signed)
Meal tray at bedside.  

## 2017-10-07 NOTE — Discharge Summary (Addendum)
Physician Discharge Summary   Patient ID: Amanda Casey MRN: [id number] DOB/AGE: 08-Oct-1970 47 y.o.  Admit date: 10/06/2017 Discharge date: 10/07/2017  Primary Care Physician:  Patient, No Pcp Per   Recommendations for Outpatient Follow-up:  1. Follow up with OB/GYN at [facility] in 1-2 weeks 2. Please obtain CBC in one week or at follow-up  Home Health: None  Equipment/Devices: None  Discharge Condition: stable  CODE STATUS: FULL  Diet recommendation: Regular diet   Discharge Diagnoses:    . Symptomatic anemia secondary to menorrhagia . Microcytic hypochromic anemia fibroid uterus   Uterine fibroids   Hypokalemia   Consults: OB/GYN, Dr Elonda Husky    Allergies:  No Known Allergies   DISCHARGE MEDICATIONS: Allergies as of 10/07/2017   No Known Allergies     Medication List    TAKE these medications   BC HEADACHE POWDER PO Take 1 packet by mouth daily as needed (headache).   dextromethorphan-guaiFENesin 30-600 MG 12hr tablet Commonly known as:  MUCINEX DM Take 1 tablet by mouth 2 (two) times daily as needed for cough.   guaiFENesin 100 MG/5ML Soln Commonly known as:  ROBITUSSIN Take 5 mLs by mouth every 4 (four) hours as needed for cough or to loosen phlegm.   iron polysaccharides 150 MG capsule Commonly known as:  NU-IRON Take 1 capsule (150 mg total) by mouth daily.   megestrol 40 MG tablet Commonly known as:  MEGACE Take 3 tablets (120 mg total) by mouth daily.   TYLENOL COLD/FLU SEVERE PO Take 2 tablets by mouth daily as needed (cold symptoms).        Brief H and P: For complete details please refer to admission H and P, but in brief Amanda Casey is a 47 y.o. female with history of tobacco abuse presents to the ER after patient has been having fatigue.  Patient has these symptoms every time she gets menstrual cycle which has become more heavy and longer durations over the last 1 year.  Also has worsening cramping abdominal pain  for which she takes ibuprofen during the menstrual period.  Patient also had a brief episode of syncope last evening while at home sitting on the bed.  Denies any blood in the stools nausea vomiting or diarrhea.  In ED, hemoglobin was 5.3   Hospital Course:     Symptomatic anemia, Microcytic hypochromic anemia secondary to menorrhagia, uterine fibroids -Hemoglobin was 5.3 in ED, patient received 3 units of packed RBC transfusion, hemoglobin 8.6 at the time of discharge -Received 1 dose of Feraheme IV -Pelvic and transvaginal ultrasound showed enlarged fibroid uterus, multiple prominent fibroids seen throughout the uterus.  No other acute abnormalities in the pelvis. -OB/GYN was consulted, Dr Elonda Husky recommended 25 mg dose of IV Premarin with repeat in 6 hours, megace 120 mg daily until seen in the outpatient setting, follow-up in 2 weeks.  Hypokalemia -Replaced  Day of Discharge S: No complaints, feeling much better  BP 113/82   Pulse 77   Temp 98.6 F (37 C) (Oral)   Resp 12   Ht 5\' 3"  (1.6 m)   Wt 59 kg (130 lb)   SpO2 97%   BMI 23.03 kg/m   Physical Exam: General: Alert and awake oriented x3 not in any acute distress. HEENT: anicteric sclera, pupils reactive to light and accommodation CVS: S1-S2 clear no murmur rubs or gallops Chest: clear to auscultation bilaterally, no wheezing rales or rhonchi Abdomen: soft nontender, nondistended, normal bowel sounds Extremities: no cyanosis,  clubbing or edema noted bilaterally Neuro: Cranial nerves II-XII intact, no focal neurological deficits   The results of significant diagnostics from this hospitalization (including imaging, microbiology, ancillary and laboratory) are listed below for reference.      Procedures/Studies:  Dg Chest 2 View  Result Date: 10/06/2017 CLINICAL DATA:  Dizziness with cough EXAM: CHEST  2 VIEW COMPARISON:  None. FINDINGS: Lungs are clear. Heart size and pulmonary vascularity are normal. No adenopathy. No  pneumothorax. No bone lesions. There is a small azygos lobe on the right, an anatomic variant. IMPRESSION: No edema or consolidation. Electronically Signed   By: Lowella Grip III M.D.   On: 10/06/2017 18:17   US Pelvic Doppler (torsion R/o Or Mass Arterial Flow)  Result Date: 10/06/2017 CLINICAL DATA:  Initial evaluation for acute vaginal bleeding. EXAM: TRANSABDOMINAL ULTRASOUND OF PELVIS DOPPLER ULTRASOUND OF OVARIES TECHNIQUE: Transabdominal ultrasound examination of the pelvis was performed including evaluation of the uterus, ovaries, adnexal regions, and pelvic cul-de-sac. Color and duplex Doppler ultrasound was utilized to evaluate blood flow to the ovaries. COMPARISON:  None. FINDINGS: Uterus Measurements: 15.5 x 9.5 x 12.0 cm. Multiple prominent fibroids are seen throughout the uterus. 4.5 x 5.0 x 4.3 cm intramural fibroid present at the uterine fundus. 5.0 x 6.6 x 5.7 cm intramural fibroid present at the left uterine body. 6.5 x 6.7 x 6.2 cm intramural fibroid present at the right mid uterine body. 4.5 x 6.2 x 6.6 cm intramural fibroid present within the lower right uterine body. Endometrium Thickness: 7 mm.  No focal abnormality visualized. Right ovary Measurements: 2.7 x 1.0 x 2.0 cm. Normal appearance/no adnexal mass. Left ovary Measurements: 4.2 x 1.3 x 2.7 cm. Normal appearance/no adnexal mass. Pulsed Doppler evaluation demonstrates normal low-resistance arterial and venous waveforms in the left ovary. Venous waveform seen within the right ovary, with a discrete arterial waveform poorly visualized, likely due to technique. No secondary signs to suggest torsion identified. IMPRESSION: 1. Enlarged fibroid uterus as above. 2. No other acute abnormality within the pelvis. Normal sonographic appearance of the ovaries. No convincing sonographic evidence for ovarian torsion with limitations as above. Electronically Signed   By: Jeannine Boga M.D.   On: 10/06/2017 19:56   US Pelvic Complete W  Transvaginal And Torsion R/o  Result Date: 10/06/2017 CLINICAL DATA:  Initial evaluation for acute vaginal bleeding. EXAM: TRANSABDOMINAL ULTRASOUND OF PELVIS DOPPLER ULTRASOUND OF OVARIES TECHNIQUE: Transabdominal ultrasound examination of the pelvis was performed including evaluation of the uterus, ovaries, adnexal regions, and pelvic cul-de-sac. Color and duplex Doppler ultrasound was utilized to evaluate blood flow to the ovaries. COMPARISON:  None. FINDINGS: Uterus Measurements: 15.5 x 9.5 x 12.0 cm. Multiple prominent fibroids are seen throughout the uterus. 4.5 x 5.0 x 4.3 cm intramural fibroid present at the uterine fundus. 5.0 x 6.6 x 5.7 cm intramural fibroid present at the left uterine body. 6.5 x 6.7 x 6.2 cm intramural fibroid present at the right mid uterine body. 4.5 x 6.2 x 6.6 cm intramural fibroid present within the lower right uterine body. Endometrium Thickness: 7 mm.  No focal abnormality visualized. Right ovary Measurements: 2.7 x 1.0 x 2.0 cm. Normal appearance/no adnexal mass. Left ovary Measurements: 4.2 x 1.3 x 2.7 cm. Normal appearance/no adnexal mass. Pulsed Doppler evaluation demonstrates normal low-resistance arterial and venous waveforms in the left ovary. Venous waveform seen within the right ovary, with a discrete arterial waveform poorly visualized, likely due to technique. No secondary signs to suggest torsion identified. IMPRESSION: 1. Enlarged  fibroid uterus as above. 2. No other acute abnormality within the pelvis. Normal sonographic appearance of the ovaries. No convincing sonographic evidence for ovarian torsion with limitations as above. Electronically Signed   By: Jeannine Boga M.D.   On: 10/06/2017 19:56       LAB RESULTS: Basic Metabolic Panel: Recent Labs  Lab 10/06/17 1617 10/07/17 0509  NA 137 137  K 4.1 3.3*  CL 105 106  CO2 23 20*  GLUCOSE 97 95  BUN 6 7  CREATININE 0.58 0.57  CALCIUM 9.2 8.5*   Liver Function Tests: No results for  input(s): AST, ALT, ALKPHOS, BILITOT, PROT, ALBUMIN in the last 168 hours. No results for input(s): LIPASE, AMYLASE in the last 168 hours. No results for input(s): AMMONIA in the last 168 hours. CBC: Recent Labs  Lab 10/07/17 0509 10/07/17 1045  WBC 8.1 7.0  NEUTROABS  --  4.3  HGB 7.9* 8.6*  HCT 27.4* 29.2*  MCV 71.0* 71.7*  PLT 416* 377   Cardiac Enzymes: No results for input(s): CKTOTAL, CKMB, CKMBINDEX, TROPONINI in the last 168 hours. BNP: Invalid input(s): POCBNP CBG: Recent Labs  Lab 10/06/17 1556  GLUCAP 86      Disposition and Follow-up: Discharge Instructions    Diet - low sodium heart healthy   Complete by:  As directed    Increase activity slowly   Complete by:  As directed        DISPOSITION: Home   DISCHARGE FOLLOW-UP Follow-up Information    Schedule an appointment as soon as possible for a visit with [facility].   Why:  In the next 2 weeks for further evaluation and treatment of your vaginal bleeding Contact information: [place] [phone]           Time coordinating discharge:  35 minutes  Signed:   Estill Cotta M.D. [facility] 10/07/2017, 1:19 PM Pager: [phone]

## 2017-10-07 NOTE — ED Notes (Signed)
Pt resting; no distress noted; no needs.

## 2017-10-07 NOTE — ED Notes (Signed)
Family at bedside. 

## 2017-10-08 LAB — BPAM RBC
Blood Product Expiration Date: 201903072359
Blood Product Expiration Date: 201903282359
Blood Product Expiration Date: 201903282359
ISSUE DATE / TIME: 201902272022
ISSUE DATE / TIME: 201902272156
ISSUE DATE / TIME: 201902280638
Unit Type and Rh: 5100
Unit Type and Rh: 5100
Unit Type and Rh: 5100

## 2017-10-08 LAB — TYPE AND SCREEN
ABO/RH(D): O POS
Antibody Screen: NEGATIVE
Unit division: 0
Unit division: 0
Unit division: 0

## 2017-11-08 ENCOUNTER — Other Ambulatory Visit (HOSPITAL_COMMUNITY)
Admission: RE | Admit: 2017-11-08 | Discharge: 2017-11-08 | Disposition: A | Discharge status: Home / Self Care | Payer: Self-pay | Source: Ambulatory Visit | Attending: Obstetrics & Gynecology | Admitting: Obstetrics & Gynecology

## 2017-11-08 ENCOUNTER — Encounter: Payer: Self-pay | Admitting: Obstetrics & Gynecology

## 2017-11-08 ENCOUNTER — Ambulatory Visit: Payer: Self-pay | Admitting: Obstetrics & Gynecology

## 2017-11-08 ENCOUNTER — Encounter: Payer: Self-pay | Admitting: *Deleted

## 2017-11-08 ENCOUNTER — Encounter (HOSPITAL_COMMUNITY): Payer: Self-pay

## 2017-11-08 VITALS — BP 122/77 | HR 89 | Wt 150.1 lb

## 2017-11-08 DIAGNOSIS — Z113 Encounter for screening for infections with a predominantly sexual mode of transmission: Secondary | ICD-10-CM

## 2017-11-08 DIAGNOSIS — D219 Benign neoplasm of connective and other soft tissue, unspecified: Secondary | ICD-10-CM

## 2017-11-08 DIAGNOSIS — Z0000 Encounter for general adult medical examination without abnormal findings: Secondary | ICD-10-CM

## 2017-11-08 DIAGNOSIS — N938 Other specified abnormal uterine and vaginal bleeding: Secondary | ICD-10-CM

## 2017-11-08 DIAGNOSIS — Z1151 Encounter for screening for human papillomavirus (HPV): Secondary | ICD-10-CM

## 2017-11-08 DIAGNOSIS — D500 Iron deficiency anemia secondary to blood loss (chronic): Secondary | ICD-10-CM

## 2017-11-08 DIAGNOSIS — Z124 Encounter for screening for malignant neoplasm of cervix: Secondary | ICD-10-CM

## 2017-11-08 LAB — POCT PREGNANCY, URINE: PREG TEST UR: NEGATIVE

## 2017-11-08 MED ORDER — MEDROXYPROGESTERONE ACETATE 150 MG/ML IM SUSP
150.0000 mg | Freq: Once | INTRAMUSCULAR | Status: AC
  Administered 2017-11-08: 150 mg via INTRAMUSCULAR

## 2017-11-08 NOTE — Progress Notes (Signed)
Patient ID: Amanda Casey, female   DOB: 06-07-1971, 47 y.o.   MRN: [id number]  Chief Complaint  Patient presents with  . Follow-up    HPI Amanda Casey is a 47 y.o. female. Single P3 (67, 72, and 37 yo kids, 5 1/2 grands) here today for follow up after admission to [facility] for transfusion with hbg of 5 due to heavy periods due to fibroids. They sent her home with megace 120 mg daily. She is still bleeding. She has had a BTL. She has always had heavy periods, lasting 7+ days. Her has fibroids (had a hysterectomy). HPI  No past medical history on file.  No past surgical history on file.  Family History  Problem Relation Age of Onset  . Diabetes Mellitus II Mother   . Hypertension Mother     Social History Social History   Tobacco Use  . Smoking status: Current Every Day Smoker  . Smokeless tobacco: Never Used  Substance Use Topics  . Alcohol use: Yes  . Drug use: No    No Known Allergies  Current Outpatient Medications  Medication Sig Dispense Refill  . acetaminophen (TYLENOL) 500 MG tablet Take 500 mg by mouth every 6 (six) hours as needed for moderate pain.    . iron polysaccharides (NU-IRON) 150 MG capsule Take 1 capsule (150 mg total) by mouth daily. 30 capsule 4  . megestrol (MEGACE) 40 MG tablet Take 3 tablets (120 mg total) by mouth daily. 90 tablet 0   No current facility-administered medications for this visit.     Review of Systems Review of Systems  Blood pressure 122/77, pulse 89, weight 150 lb 1.6 oz (68.1 kg), last menstrual period 11/04/2017.  Physical Exam Physical Exam  Breathing, conversing, and ambulating normally Well nourished, well hydrated Black female, no apparent distress Uterus palpable up to umbilicus Cervix nulliparous  UPT negative, consent signed, time out done Cervix prepped with betadine and grasped with a single tooth tenaculum Uterus sounded to 9 cm Pipelle used for 2 passes with a moderate amount of tissue obtained. She  tolerated the procedure well.    Data Reviewed  IMPRESSION: 1. Enlarged fibroid uterus as above. 2. No other acute abnormality within the pelvis. Normal sonographic appearance of the ovaries. No convincing sonographic evidence for ovarian torsion with limitations as above. Assessment    Symptomatic fibroids, anemia    Plan    Depo provera today Await EMBX pathology Await pap Sign up for financial aid Amanda Casey an email to schedule TAH/BS       Amanda Casey 11/08/2017, 10:06 AM

## 2017-11-09 LAB — CYTOLOGY - PAP
CHLAMYDIA, DNA PROBE: NEGATIVE
Diagnosis: NEGATIVE
HPV: NOT DETECTED
Neisseria Gonorrhea: NEGATIVE

## 2018-01-07 ENCOUNTER — Encounter (HOSPITAL_COMMUNITY): Payer: Self-pay

## 2018-01-07 ENCOUNTER — Observation Stay (HOSPITAL_COMMUNITY)
Admission: AD | Admit: 2018-01-07 | Discharge: 2018-01-08 | Disposition: A | Discharge status: Home / Self Care | Payer: Medicaid Other | Source: Ambulatory Visit | Attending: Obstetrics and Gynecology | Admitting: Obstetrics and Gynecology

## 2018-01-07 ENCOUNTER — Encounter: Payer: Self-pay | Admitting: Obstetrics and Gynecology

## 2018-01-07 ENCOUNTER — Other Ambulatory Visit: Payer: Self-pay | Admitting: Obstetrics and Gynecology

## 2018-01-07 ENCOUNTER — Other Ambulatory Visit: Payer: Self-pay

## 2018-01-07 ENCOUNTER — Inpatient Hospital Stay (HOSPITAL_COMMUNITY)
Admission: AD | Admit: 2018-01-07 | Discharge: 2018-01-07 | Discharge status: Absent without leave | Payer: Medicaid Other | Attending: Obstetrics and Gynecology | Admitting: Obstetrics and Gynecology

## 2018-01-07 ENCOUNTER — Telehealth: Payer: Self-pay | Admitting: Obstetrics and Gynecology

## 2018-01-07 ENCOUNTER — Encounter (HOSPITAL_COMMUNITY)
Admission: RE | Admit: 2018-01-07 | Discharge: 2018-01-07 | Disposition: A | Discharge status: Home / Self Care | Payer: Self-pay | Source: Ambulatory Visit | Attending: Obstetrics & Gynecology | Admitting: Obstetrics & Gynecology

## 2018-01-07 DIAGNOSIS — D500 Iron deficiency anemia secondary to blood loss (chronic): Secondary | ICD-10-CM

## 2018-01-07 DIAGNOSIS — N938 Other specified abnormal uterine and vaginal bleeding: Secondary | ICD-10-CM | POA: Insufficient documentation

## 2018-01-07 DIAGNOSIS — D649 Anemia, unspecified: Secondary | ICD-10-CM | POA: Insufficient documentation

## 2018-01-07 DIAGNOSIS — D219 Benign neoplasm of connective and other soft tissue, unspecified: Secondary | ICD-10-CM | POA: Insufficient documentation

## 2018-01-07 DIAGNOSIS — N939 Abnormal uterine and vaginal bleeding, unspecified: Secondary | ICD-10-CM | POA: Insufficient documentation

## 2018-01-07 DIAGNOSIS — Z01818 Encounter for other preprocedural examination: Secondary | ICD-10-CM | POA: Insufficient documentation

## 2018-01-07 LAB — CBC
HEMATOCRIT: 19.5 % — AB (ref 36.0–46.0)
Hemoglobin: 5.6 g/dL — CL (ref 12.0–15.0)
MCH: 19.9 pg — ABNORMAL LOW (ref 26.0–34.0)
MCHC: 28.7 g/dL — AB (ref 30.0–36.0)
MCV: 69.4 fL — AB (ref 78.0–100.0)
Platelets: 420 10*3/uL — ABNORMAL HIGH (ref 150–400)
RBC: 2.81 MIL/uL — ABNORMAL LOW (ref 3.87–5.11)
RDW: 21.9 % — AB (ref 11.5–15.5)
WBC: 4.3 10*3/uL (ref 4.0–10.5)

## 2018-01-07 LAB — BASIC METABOLIC PANEL
Anion gap: 9 (ref 5–15)
BUN: 8 mg/dL (ref 6–20)
CHLORIDE: 108 mmol/L (ref 101–111)
CO2: 20 mmol/L — AB (ref 22–32)
Calcium: 8.6 mg/dL — ABNORMAL LOW (ref 8.9–10.3)
Creatinine, Ser: 0.58 mg/dL (ref 0.44–1.00)
GFR calc Af Amer: >60 mL/min (ref >60)
GFR calc non Af Amer: >60 mL/min (ref >60)
GLUCOSE: 100 mg/dL — AB (ref 65–99)
POTASSIUM: 3.5 mmol/L (ref 3.5–5.1)
Sodium: 137 mmol/L (ref 135–145)

## 2018-01-07 LAB — TYPE AND SCREEN
ABO/RH(D): O POS
Antibody Screen: NEGATIVE
UNIT DIVISION: 0
UNIT DIVISION: 0
Unit division: 0
Unit division: 0

## 2018-01-07 LAB — BPAM RBC
BLOOD PRODUCT EXPIRATION DATE: 201906202359
BLOOD PRODUCT EXPIRATION DATE: 201906282359
Blood Product Expiration Date: 201906282359
Blood Product Expiration Date: 201906282359
Unit Type and Rh: 5100
Unit Type and Rh: 5100
Unit Type and Rh: 5100
Unit Type and Rh: 5100

## 2018-01-07 LAB — PREPARE RBC (CROSSMATCH)

## 2018-01-07 LAB — ABO/RH: ABO/RH(D): O POS

## 2018-01-07 LAB — TSH: TSH: 0.88 u[IU]/mL (ref 0.350–4.500)

## 2018-01-07 MED ORDER — ACETAMINOPHEN 325 MG PO TABS
650.0000 mg | ORAL_TABLET | Freq: Once | ORAL | Status: AC
  Administered 2018-01-07: 650 mg via ORAL
  Filled 2018-01-07: qty 2

## 2018-01-07 MED ORDER — SODIUM CHLORIDE 0.9 % IV SOLN: 250.0000 mL | INTRAVENOUS | Status: DC | PRN

## 2018-01-07 MED ORDER — ZOLPIDEM TARTRATE 5 MG PO TABS: 5.0000 mg | ORAL_TABLET | Freq: Every evening | ORAL | Status: DC | PRN

## 2018-01-07 MED ORDER — POLYETHYLENE GLYCOL 3350 17 G PO PACK: 17.0000 g | PACK | Freq: Every day | ORAL | Status: DC | PRN

## 2018-01-07 MED ORDER — ONDANSETRON HCL 4 MG PO TABS: 4.0000 mg | ORAL_TABLET | Freq: Four times a day (QID) | ORAL | Status: DC | PRN

## 2018-01-07 MED ORDER — ONDANSETRON HCL 4 MG/2ML IJ SOLN: 4.0000 mg | Freq: Four times a day (QID) | INTRAMUSCULAR | Status: DC | PRN

## 2018-01-07 MED ORDER — SODIUM CHLORIDE 0.9 % IV SOLN
Freq: Once | INTRAVENOUS | Status: AC
  Administered 2018-01-07: 21:00:00 via INTRAVENOUS

## 2018-01-07 MED ORDER — ACETAMINOPHEN 500 MG PO TABS
1000.0000 mg | ORAL_TABLET | ORAL | Status: DC | PRN
  Administered 2018-01-07: 1000 mg via ORAL
  Filled 2018-01-07: qty 2

## 2018-01-07 MED ORDER — SODIUM CHLORIDE 0.9% FLUSH: 3.0000 mL | INTRAVENOUS | Status: DC | PRN

## 2018-01-07 MED ORDER — SODIUM CHLORIDE 0.9% FLUSH: 3.0000 mL | Freq: Two times a day (BID) | INTRAVENOUS | Status: DC

## 2018-01-07 NOTE — Progress Notes (Signed)
Patient had Type and screen drawn earlier today in office today. Blood band orblood identification label was not placed on patient. Type and screen will be redrawn STAT. And blood transfusion will be started after cross match has been completed   Notified attending provider of confusion with blood band and type and screen issues.

## 2018-01-07 NOTE — Patient Instructions (Addendum)
Your procedure is scheduled on: Tuesday January 18, 2018 at 10:30 am  Enter through the Main Entrance of [facility] at:9:00 am  Pick up the phone at the desk and dial [phone].  Call this number if you have problems the morning of surgery: [phone].  Remember: Do NOT eat food or drink any liquids after:Midnight on Monday June 10  Take these medicines the morning of surgery with a SIP OF WATER: None  STOP ALL VITAMINS, SUPPLEMENTS, HERBAL MEDICATIONS NOW  DO NOT SMOKE DAY OF SURGERY  Do NOT wear jewelry (body piercing), metal hair clips/bobby pins, make-up, or nail polish. Do NOT wear lotions, powders, or perfumes.  You may wear deoderant. Do NOT shave for 48 hours prior to surgery. Do NOT bring valuables to the hospital. Contacts, dentures, or bridgework may not be worn into surgery. Leave suitcase in car.  After surgery it may be brought to your room.    For patients admitted to the hospital, checkout time is 11:00 AM the day of discharge.

## 2018-01-07 NOTE — Telephone Encounter (Addendum)
GYN Telephone Note I received a call from Dr. Hulan Fray re: the anemia on her PAT labs for Amanda Casey and she would like for her to have 4U PRBCs. Patient called at [phone] and d/w her and she amenable to admission overnight. D/w 3rd floor charge and patient okay for direct admission.   Patient states LMP on 4/1 when she got depo provera in clinic and bleeding slowed at first but now waxes and wanes in intensity. Currently have two pad per day bleeding (not always saturated) and not currently symptomatic.  Likely will start something with PO estrogen to keep her from bleeding before surgery.  Durene Romans MD Attending Center for [employer] [facility] (Faculty Practice) 01/07/2018 Time: [phone]

## 2018-01-07 NOTE — Pre-Procedure Instructions (Signed)
Spoke to Dr. Hulan Fray about hemoglobin of 5.6. She will take care of it.

## 2018-01-07 NOTE — H&P (Addendum)
Amanda Casey is for transfusions due to significant anemia with hemoglobin documented as 5.6, hematocrit 19.5 with hypochromic microcytic indices on today's preoperative labs she is tentatively scheduled by Dr. Rubin Payor for an abdominal hysterectomy during June  She is an 47 y.o. female.,  Amanda Casey 3 para 3, 3 adult children, status post tubal ligation currently being managed with Depo-Provera but with continued light bleeding.  She was recently hospitalized In in February at [facility] for severe anemia, and fatigue at the time of that admission her hemoglobin was 5.3, with ultrasound showing large fibroid uterus.  She received 3 units packed cells, 1 dose of IV Feraheme and was discharged home on Megace 120 mg daily with follow-up by Dr. Hulan Fray April 1 her pregnancy test was negative, endometrial biopsy was benign and plans for surgical treatment made.  Today's hemoglobin 5.6 Pertinent Gynecological History: Menses: irregular occurring approximately every few days with spotting approximately 20+ days per month Bleeding: dysfunctional uterine bleeding Contraception: Depo-Provera injections AND TUBAL DES exposure: unknown Blood transfusions: 3 UNITS IN FEB, TO RECEIVE 4 UNITS TONIGHT Sexually transmitted diseases: no past history Previous GYN Procedures: ENDOMETRIAL BIOPSY 11/08/17  Last mammogram: normal Date: 2006 Last pap: normal Date: 11/08/17 OB History: G3, P3   Menstrual History: Menarche age:  No LMP recorded.IRREGULAR BLEEDING, ON DEPO PROVERA    No past medical history on file.  Past Surgical History:  Procedure Laterality Date  . TUBAL LIGATION      Family History  Problem Relation Age of Onset  . Diabetes Mellitus II Mother   . Hypertension Mother     Social History:  reports that she has been smoking.  She has a 3.00 pack-year smoking history. She has never used smokeless tobacco. She reports that she drinks about 1.2 - 1.8 oz of alcohol per week. She reports that she does  not use drugs.  Allergies: No Known Allergies  Medications Prior to Admission  Medication Sig Dispense Refill Last Dose  . acetaminophen (TYLENOL) 500 MG tablet Take 500 mg by mouth every 6 (six) hours as needed for moderate pain.   01/06/2018 at Unknown time    Review of Systems  Musculoskeletal: Positive for myalgias.    Blood pressure 98/69, pulse 87, temperature 99.1 F (37.3 C), temperature source Oral, resp. rate 18, height 5' 2.5" (1.588 m), weight 150 lb 6 oz (68.2 kg), SpO2 100 %. Physical Exam  Constitutional: She is oriented to person, place, and time. She appears well-developed and well-nourished.  HENT:  Head: Normocephalic.  Eyes: Pupils are equal, round, and reactive to light.  Neck: Normal range of motion.  Respiratory: Effort normal.  GI: Soft. She exhibits mass.  FIBROID ENLARGEMENT TO NEARLY THE UMBILICUS  Musculoskeletal: Normal range of motion.  Neurological: She is alert and oriented to person, place, and time.  Skin: Skin is warm and dry. No rash noted. No erythema. No pallor.  Psychiatric: She has a normal mood and affect. Her behavior is normal. Judgment and thought content normal.    Results for orders placed or performed during the hospital encounter of 01/07/18 (from the past 24 hour(s))  Prepare RBC     Status: None   Collection Time: 01/07/18  6:19 PM  Result Value Ref Range   Order Confirmation      ORDER PROCESSED BY BLOOD BANK Performed at [facility], [address] [place],  [address]   TSH     Status: None   Collection Time: 01/07/18  7:32 PM  Result Value Ref Range   TSH 0.880 0.350 - 4.500 uIU/mL    No results found.  Assessment/Plan: UTERINE FIBROID AUB ANEMIA TO 5.6 HGB pLAN  TRANSFUSE 4 UNITS PRBC PER DR DOVE/ AND PICKENS D/C ON OCP OR ADD ESTRADIOL TO CURRENT DEPO PROVERA REGIMEN POST TRANSFUSION CBC IN 4 HR P LAST UNIT. Jonnie Kind 01/07/2018, 10:12 PM

## 2018-01-08 DIAGNOSIS — Z9289 Personal history of other medical treatment: Secondary | ICD-10-CM

## 2018-01-08 DIAGNOSIS — D500 Iron deficiency anemia secondary to blood loss (chronic): Secondary | ICD-10-CM

## 2018-01-08 HISTORY — DX: Personal history of other medical treatment: Z92.89

## 2018-01-08 LAB — CBC
HEMATOCRIT: 33.4 % — AB (ref 36.0–46.0)
HEMOGLOBIN: 10.5 g/dL — AB (ref 12.0–15.0)
MCH: 23.8 pg — AB (ref 26.0–34.0)
MCHC: 31.4 g/dL (ref 30.0–36.0)
MCV: 75.6 fL — ABNORMAL LOW (ref 78.0–100.0)
Platelets: 313 10*3/uL (ref 150–400)
RBC: 4.42 MIL/uL (ref 3.87–5.11)
RDW: 20.7 % — ABNORMAL HIGH (ref 11.5–15.5)
WBC: 6.3 10*3/uL (ref 4.0–10.5)

## 2018-01-08 MED ORDER — FERROUS SULFATE 325 (65 FE) MG PO TABS: 325.0000 mg | ORAL_TABLET | Freq: Two times a day (BID) | ORAL | 1 refills | Status: DC

## 2018-01-08 NOTE — Discharge Summary (Signed)
OB Discharge Summary     Patient Name: Amanda Casey DOB: 04/09/1971 MRN: [id number]  Date of admission: 01/07/2018 Delivering MD: This patient has no babies on file.  Date of discharge: 01/08/2018  Admitting diagnosis: BLOOD TRANSFUSION Intrauterine pregnancy: Unknown     Secondary diagnosis:  Active Problems:   Anemia   Discharge diagnosis: symptomatic anemia secondary to DUB                                                                                                Hospital course:  Patient admitted for blood transfusion due to anemia on pre-operative labs (hg 5.6) Patient has a history of DUB resulting in in anemia. She has received a blood transfusion in February 2019. Her DUB is currently being managed with depo-provera and megace. She is a smoker. She is scheduled for hysterectomy on 6/11. Patient received 4 units pRBC. Her post transfusion Hg is 10.5. She reports very light vaginal bleeding. Patient found stable for discharge with plans to keep surgical appointment. Discharge precautions were reviewed  Physical exam  Vitals:   01/08/18 0844 01/08/18 0900 01/08/18 1039 01/08/18 1137  BP: 113/75 114/72 111/79 112/77  Pulse: 76 78 78 74  Resp: 16 16 16 17   Temp: 98.3 F (36.8 C) (!) 97.5 F (36.4 C) 98.5 F (36.9 C) 99 F (37.2 C)  TempSrc: Oral Oral Oral Oral  SpO2: 100% 100% 100% 100%  Weight:      Height:       GENERAL: Well-developed, well-nourished female in no acute distress.  LUNGS: Clear to auscultation bilaterally.  HEART: Regular rate and rhythm. ABDOMEN: Soft, nontender, palpable fibroid uterus PELVIC: Not performed EXTREMITIES: No cyanosis, clubbing, or edema, 2+ distal pulses.  Labs: Lab Results  Component Value Date   WBC 6.3 01/08/2018   HGB 10.5 (L) 01/08/2018   HCT 33.4 (L) 01/08/2018   MCV 75.6 (L) 01/08/2018   PLT 313 01/08/2018   CMP Latest Ref Rng & Units 01/07/2018  Glucose 65 - 99 mg/dL 100(H)  BUN 6 - 20 mg/dL 8  Creatinine  0.44 - 1.00 mg/dL 0.58  Sodium 135 - 145 mmol/L 137  Potassium 3.5 - 5.1 mmol/L 3.5  Chloride 101 - 111 mmol/L 108  CO2 22 - 32 mmol/L 20(L)  Calcium 8.9 - 10.3 mg/dL 8.6(L)    Discharge instruction: per After Visit Summary   After visit meds:  Allergies as of 01/08/2018   No Known Allergies     Medication List    TAKE these medications   acetaminophen 500 MG tablet Commonly known as:  TYLENOL Take 500 mg by mouth every 6 (six) hours as needed for moderate pain.   ferrous sulfate 325 (65 FE) MG tablet Commonly known as:  FERROUSUL Take 1 tablet (325 mg total) by mouth 2 (two) times daily.       Diet: routine diet  Activity: Advance as tolerated. Pelvic rest for 6 weeks.   Outpatient follow up: as planned for TAH Follow up Appt:No future appointments. Follow up Visit:No follow-ups on file.    01/08/2018 Mora Bellman, MD

## 2018-01-08 NOTE — Progress Notes (Signed)
Discharge instructions and prescriptions given to pt. Discussed signs and symptoms to report to the MD, upcoming appointments, and meds. Pt verbalizes understanding and has no questions or concerns at this time. IV taken out and pt tolerated well. Pt discharged from hospital in stable condition. 

## 2018-01-09 LAB — TYPE AND SCREEN
ABO/RH(D): O POS
ANTIBODY SCREEN: NEGATIVE
Unit division: 0
Unit division: 0
Unit division: 0
Unit division: 0
Unit division: 0

## 2018-01-09 LAB — BPAM RBC
BLOOD PRODUCT EXPIRATION DATE: 201906052359
BLOOD PRODUCT EXPIRATION DATE: 201906202359
BLOOD PRODUCT EXPIRATION DATE: 201906282359
BLOOD PRODUCT EXPIRATION DATE: 201906282359
Blood Product Expiration Date: 201906282359
ISSUE DATE / TIME: 201906010526
ISSUE DATE / TIME: 201905312356
ISSUE DATE / TIME: 201906010310
ISSUE DATE / TIME: 201906010834
UNIT TYPE AND RH: 5100
UNIT TYPE AND RH: 5100
UNIT TYPE AND RH: 9500
Unit Type and Rh: 5100
Unit Type and Rh: 5100

## 2018-01-18 ENCOUNTER — Encounter (HOSPITAL_COMMUNITY): Payer: Self-pay

## 2018-01-18 ENCOUNTER — Other Ambulatory Visit: Payer: Self-pay

## 2018-01-18 ENCOUNTER — Encounter (HOSPITAL_COMMUNITY)
Admission: AD | Disposition: A | Discharge status: Home / Self Care | Payer: Self-pay | Source: Home / Self Care | Attending: Obstetrics & Gynecology

## 2018-01-18 ENCOUNTER — Inpatient Hospital Stay (HOSPITAL_COMMUNITY): Payer: Self-pay | Admitting: Certified Registered Nurse Anesthetist

## 2018-01-18 ENCOUNTER — Encounter (HOSPITAL_COMMUNITY): Payer: Self-pay | Admitting: *Deleted

## 2018-01-18 ENCOUNTER — Inpatient Hospital Stay (HOSPITAL_COMMUNITY)
Admission: AD | Admit: 2018-01-18 | Discharge: 2018-01-20 | DRG: 743 | Disposition: A | Discharge status: Home / Self Care | Payer: Self-pay | Attending: Obstetrics & Gynecology | Admitting: Obstetrics & Gynecology

## 2018-01-18 DIAGNOSIS — D649 Anemia, unspecified: Secondary | ICD-10-CM

## 2018-01-18 DIAGNOSIS — D259 Leiomyoma of uterus, unspecified: Principal | ICD-10-CM | POA: Diagnosis present

## 2018-01-18 DIAGNOSIS — F17210 Nicotine dependence, cigarettes, uncomplicated: Secondary | ICD-10-CM | POA: Diagnosis present

## 2018-01-18 DIAGNOSIS — Z9889 Other specified postprocedural states: Secondary | ICD-10-CM

## 2018-01-18 HISTORY — DX: Anemia, unspecified: D64.9

## 2018-01-18 HISTORY — PX: CYSTOSCOPY: SHX5120

## 2018-01-18 HISTORY — PX: HYSTERECTOMY ABDOMINAL WITH SALPINGECTOMY: SHX6725

## 2018-01-18 HISTORY — DX: Personal history of other medical treatment: Z92.89

## 2018-01-18 LAB — TYPE AND SCREEN
ABO/RH(D): O POS
ANTIBODY SCREEN: NEGATIVE

## 2018-01-18 LAB — CBC
HEMATOCRIT: 30.8 % — AB (ref 36.0–46.0)
HEMOGLOBIN: 9.4 g/dL — AB (ref 12.0–15.0)
MCH: 24.5 pg — ABNORMAL LOW (ref 26.0–34.0)
MCHC: 30.5 g/dL (ref 30.0–36.0)
MCV: 80.4 fL (ref 78.0–100.0)
Platelets: 336 10*3/uL (ref 150–400)
RBC: 3.83 MIL/uL — AB (ref 3.87–5.11)
RDW: 28.1 % — ABNORMAL HIGH (ref 11.5–15.5)
WBC: 7 10*3/uL (ref 4.0–10.5)

## 2018-01-18 LAB — PREGNANCY, URINE: Preg Test, Ur: NEGATIVE

## 2018-01-18 SURGERY — HYSTERECTOMY, TOTAL, ABDOMINAL, WITH SALPINGECTOMY
Anesthesia: Spinal | Site: Urethra

## 2018-01-18 MED ORDER — PHENYLEPHRINE 40 MCG/ML (10ML) SYRINGE FOR IV PUSH (FOR BLOOD PRESSURE SUPPORT)
PREFILLED_SYRINGE | INTRAVENOUS | Status: AC
  Filled 2018-01-18: qty 10

## 2018-01-18 MED ORDER — HYDROMORPHONE HCL 1 MG/ML IJ SOLN
INTRAMUSCULAR | Status: DC | PRN
  Administered 2018-01-18: 1 mg via INTRAVENOUS

## 2018-01-18 MED ORDER — HYDROMORPHONE HCL 1 MG/ML IJ SOLN: 0.2500 mg | INTRAMUSCULAR | Status: DC | PRN

## 2018-01-18 MED ORDER — METHYLENE BLUE 0.5 % INJ SOLN
INTRAVENOUS | Status: DC | PRN
  Administered 2018-01-18: 2 mL via INTRAVENOUS
  Administered 2018-01-18: 5 mL via INTRAVENOUS
  Administered 2018-01-18: 1 mL via INTRAVENOUS
  Administered 2018-01-18: 2 mL via INTRAVENOUS

## 2018-01-18 MED ORDER — STERILE WATER FOR IRRIGATION IR SOLN
Status: DC | PRN
  Administered 2018-01-18: 1000 mL

## 2018-01-18 MED ORDER — KETOROLAC TROMETHAMINE 30 MG/ML IJ SOLN
30.0000 mg | Freq: Once | INTRAMUSCULAR | Status: AC | PRN
  Administered 2018-01-18: 30 mg via INTRAVENOUS
  Filled 2018-01-18: qty 1

## 2018-01-18 MED ORDER — ONDANSETRON HCL 4 MG/2ML IJ SOLN
INTRAMUSCULAR | Status: DC | PRN
  Administered 2018-01-18: 4 mg via INTRAVENOUS

## 2018-01-18 MED ORDER — DEXAMETHASONE SODIUM PHOSPHATE 10 MG/ML IJ SOLN
INTRAMUSCULAR | Status: DC | PRN
  Administered 2018-01-18 (×2): 5 mg via INTRAVENOUS

## 2018-01-18 MED ORDER — PROPOFOL 10 MG/ML IV BOLUS
INTRAVENOUS | Status: AC
  Filled 2018-01-18: qty 40

## 2018-01-18 MED ORDER — METHYLENE BLUE 0.5 % INJ SOLN
INTRAVENOUS | Status: AC
  Filled 2018-01-18: qty 10

## 2018-01-18 MED ORDER — SCOPOLAMINE 1 MG/3DAYS TD PT72
MEDICATED_PATCH | TRANSDERMAL | Status: AC
  Filled 2018-01-18: qty 1

## 2018-01-18 MED ORDER — MIDAZOLAM HCL 2 MG/2ML IJ SOLN
INTRAMUSCULAR | Status: DC | PRN
  Administered 2018-01-18 (×2): 1 mg via INTRAVENOUS

## 2018-01-18 MED ORDER — MIDAZOLAM HCL 2 MG/2ML IJ SOLN
INTRAMUSCULAR | Status: AC
  Filled 2018-01-18: qty 2

## 2018-01-18 MED ORDER — SUGAMMADEX SODIUM 200 MG/2ML IV SOLN
INTRAVENOUS | Status: AC
  Filled 2018-01-18: qty 2

## 2018-01-18 MED ORDER — FENTANYL CITRATE (PF) 250 MCG/5ML IJ SOLN
INTRAMUSCULAR | Status: AC
  Filled 2018-01-18: qty 5

## 2018-01-18 MED ORDER — SCOPOLAMINE 1 MG/3DAYS TD PT72
1.0000 | MEDICATED_PATCH | Freq: Once | TRANSDERMAL | Status: DC
  Administered 2018-01-18: 1.5 mg via TRANSDERMAL

## 2018-01-18 MED ORDER — LACTATED RINGERS IV SOLN: INTRAVENOUS | Status: DC

## 2018-01-18 MED ORDER — HYDROMORPHONE HCL 1 MG/ML IJ SOLN
0.2000 mg | INTRAMUSCULAR | Status: DC | PRN
  Administered 2018-01-18: 0.6 mg via INTRAVENOUS
  Filled 2018-01-18: qty 1

## 2018-01-18 MED ORDER — PROPOFOL 10 MG/ML IV BOLUS
INTRAVENOUS | Status: AC
  Filled 2018-01-18: qty 20

## 2018-01-18 MED ORDER — MEPERIDINE HCL 25 MG/ML IJ SOLN: 6.2500 mg | INTRAMUSCULAR | Status: DC | PRN

## 2018-01-18 MED ORDER — IBUPROFEN 800 MG PO TABS
800.0000 mg | ORAL_TABLET | Freq: Three times a day (TID) | ORAL | Status: DC | PRN
  Administered 2018-01-19 – 2018-01-20 (×3): 800 mg via ORAL
  Filled 2018-01-18 (×3): qty 1

## 2018-01-18 MED ORDER — LACTATED RINGERS IV SOLN
INTRAVENOUS | Status: DC
  Administered 2018-01-18 (×5): via INTRAVENOUS

## 2018-01-18 MED ORDER — BUPIVACAINE HCL (PF) 0.5 % IJ SOLN
INTRAMUSCULAR | Status: DC | PRN
  Administered 2018-01-18: 30 mL

## 2018-01-18 MED ORDER — CEFAZOLIN SODIUM-DEXTROSE 2-4 GM/100ML-% IV SOLN
2.0000 g | INTRAVENOUS | Status: AC
  Administered 2018-01-18: 2 g via INTRAVENOUS

## 2018-01-18 MED ORDER — PROMETHAZINE HCL 25 MG/ML IJ SOLN: 6.2500 mg | INTRAMUSCULAR | Status: DC | PRN

## 2018-01-18 MED ORDER — OXYCODONE-ACETAMINOPHEN 5-325 MG PO TABS
1.0000 | ORAL_TABLET | ORAL | Status: DC | PRN
  Administered 2018-01-18 – 2018-01-20 (×10): 1 via ORAL
  Filled 2018-01-18 (×10): qty 1

## 2018-01-18 MED ORDER — ONDANSETRON HCL 4 MG/2ML IJ SOLN
INTRAMUSCULAR | Status: AC
  Filled 2018-01-18: qty 2

## 2018-01-18 MED ORDER — HYDROMORPHONE HCL 1 MG/ML IJ SOLN
INTRAMUSCULAR | Status: AC
  Filled 2018-01-18: qty 1

## 2018-01-18 MED ORDER — PROPOFOL 500 MG/50ML IV EMUL
INTRAVENOUS | Status: DC | PRN
  Administered 2018-01-18: 75 ug/kg/min via INTRAVENOUS

## 2018-01-18 MED ORDER — LIDOCAINE HCL (CARDIAC) PF 100 MG/5ML IV SOSY
PREFILLED_SYRINGE | INTRAVENOUS | Status: AC
  Filled 2018-01-18: qty 5

## 2018-01-18 MED ORDER — ONDANSETRON HCL 4 MG/2ML IJ SOLN: 4.0000 mg | Freq: Four times a day (QID) | INTRAMUSCULAR | Status: DC | PRN

## 2018-01-18 MED ORDER — BUPIVACAINE HCL (PF) 0.5 % IJ SOLN
INTRAMUSCULAR | Status: AC
  Filled 2018-01-18: qty 30

## 2018-01-18 MED ORDER — ROCURONIUM BROMIDE 100 MG/10ML IV SOLN
INTRAVENOUS | Status: AC
  Filled 2018-01-18: qty 1

## 2018-01-18 MED ORDER — DEXAMETHASONE SODIUM PHOSPHATE 10 MG/ML IJ SOLN
INTRAMUSCULAR | Status: AC
  Filled 2018-01-18: qty 1

## 2018-01-18 MED ORDER — BUPIVACAINE IN DEXTROSE 0.75-8.25 % IT SOLN
INTRATHECAL | Status: DC | PRN
  Administered 2018-01-18: 1.8 mL via INTRATHECAL

## 2018-01-18 MED ORDER — OXYCODONE HCL 5 MG/5ML PO SOLN: 5.0000 mg | Freq: Once | ORAL | Status: DC | PRN

## 2018-01-18 MED ORDER — PHENYLEPHRINE HCL 10 MG/ML IJ SOLN
INTRAMUSCULAR | Status: DC | PRN
  Administered 2018-01-18 (×3): 80 ug via INTRAVENOUS
  Administered 2018-01-18: 120 ug via INTRAVENOUS

## 2018-01-18 MED ORDER — FENTANYL CITRATE (PF) 100 MCG/2ML IJ SOLN
INTRAMUSCULAR | Status: DC | PRN
  Administered 2018-01-18: 50 ug via INTRAVENOUS
  Administered 2018-01-18: 100 ug via INTRAVENOUS
  Administered 2018-01-18 (×4): 50 ug via INTRAVENOUS

## 2018-01-18 MED ORDER — LIDOCAINE HCL (CARDIAC) PF 100 MG/5ML IV SOSY
PREFILLED_SYRINGE | INTRAVENOUS | Status: DC | PRN
  Administered 2018-01-18: 60 mg via INTRAVENOUS

## 2018-01-18 MED ORDER — ONDANSETRON HCL 4 MG PO TABS: 4.0000 mg | ORAL_TABLET | Freq: Four times a day (QID) | ORAL | Status: DC | PRN

## 2018-01-18 MED ORDER — PROPOFOL 10 MG/ML IV BOLUS
INTRAVENOUS | Status: DC | PRN
  Administered 2018-01-18: 10 mg via INTRAVENOUS

## 2018-01-18 MED ORDER — OXYCODONE HCL 5 MG PO TABS: 5.0000 mg | ORAL_TABLET | Freq: Once | ORAL | Status: DC | PRN

## 2018-01-18 SURGICAL SUPPLY — 34 items
BENZOIN TINCTURE PRP APPL 2/3 (GAUZE/BANDAGES/DRESSINGS) ×4 IMPLANT
CANISTER SUCT 3000ML PPV (MISCELLANEOUS) ×4 IMPLANT
CLOSURE WOUND 1/2 X4 (GAUZE/BANDAGES/DRESSINGS) ×1
CONT PATH 16OZ SNAP LID 3702 (MISCELLANEOUS) ×4 IMPLANT
DECANTER SPIKE VIAL GLASS SM (MISCELLANEOUS) IMPLANT
DRAPE CESAREAN BIRTH W POUCH (DRAPES) ×4 IMPLANT
DRAPE WARM FLUID 44X44 (DRAPE) IMPLANT
DRSG OPSITE POSTOP 4X10 (GAUZE/BANDAGES/DRESSINGS) ×4 IMPLANT
DURAPREP 26ML APPLICATOR (WOUND CARE) ×4 IMPLANT
GAUZE SPONGE 4X4 16PLY XRAY LF (GAUZE/BANDAGES/DRESSINGS) ×4 IMPLANT
GLOVE BIO SURGEON STRL SZ 6.5 (GLOVE) ×3 IMPLANT
GLOVE BIO SURGEONS STRL SZ 6.5 (GLOVE) ×1
GLOVE BIOGEL PI IND STRL 7.0 (GLOVE) ×4 IMPLANT
GLOVE BIOGEL PI INDICATOR 7.0 (GLOVE) ×4
GOWN STRL REUS W/TWL LRG LVL3 (GOWN DISPOSABLE) ×12 IMPLANT
HEMOSTAT ARISTA ABSORB 3G PWDR (MISCELLANEOUS) IMPLANT
NEEDLE SPNL 18GX3.5 QUINCKE PK (NEEDLE) ×4 IMPLANT
NS IRRIG 1000ML POUR BTL (IV SOLUTION) ×4 IMPLANT
PACK ABDOMINAL GYN (CUSTOM PROCEDURE TRAY) ×4 IMPLANT
PAD OB MATERNITY 4.3X12.25 (PERSONAL CARE ITEMS) ×4 IMPLANT
PENCIL SMOKE EVAC W/HOLSTER (ELECTROSURGICAL) ×4 IMPLANT
PROTECTOR NERVE ULNAR (MISCELLANEOUS) ×8 IMPLANT
SET CYSTO W/LG BORE CLAMP LF (SET/KITS/TRAYS/PACK) ×4 IMPLANT
SPONGE LAP 18X18 X RAY DECT (DISPOSABLE) ×8 IMPLANT
STRIP CLOSURE SKIN 1/2X4 (GAUZE/BANDAGES/DRESSINGS) ×3 IMPLANT
SUT CHROMIC 3 0 SH 27 (SUTURE) IMPLANT
SUT PDS AB 0 CTX 60 (SUTURE) ×4 IMPLANT
SUT VIC AB 0 CT1 36 (SUTURE) ×4 IMPLANT
SUT VIC AB 2-0 CT1 18 (SUTURE) ×16 IMPLANT
SUT VIC AB 3-0 CT1 27 (SUTURE) ×2
SUT VIC AB 3-0 CT1 TAPERPNT 27 (SUTURE) ×2 IMPLANT
SYR 30ML LL (SYRINGE) ×4 IMPLANT
TOWEL OR 17X24 6PK STRL BLUE (TOWEL DISPOSABLE) ×8 IMPLANT
TRAY FOLEY W/BAG SLVR 14FR (SET/KITS/TRAYS/PACK) ×4 IMPLANT

## 2018-01-18 NOTE — Transfer of Care (Signed)
Immediate Anesthesia Transfer of Care Note  Patient: Amanda Casey  Procedure(s) Performed: HYSTERECTOMY ABDOMINAL WITH SALPINGECTOMY (Bilateral Abdomen) CYSTOSCOPY (N/A Urethra)  Patient Location: PACU  Anesthesia Type:Spinal  Level of Consciousness: awake, alert  and oriented  Airway & Oxygen Therapy: Patient Spontanous Breathing and Patient connected to nasal cannula oxygen  Post-op Assessment: Report given to RN and Post -op Vital signs reviewed and stable  Post vital signs: Reviewed and stable  Last Vitals:  Vitals Value Taken Time  BP    Temp    Pulse    Resp    SpO2      Last Pain:  Vitals:   01/18/18 0827  TempSrc: Oral  PainSc: 0-No pain      Patients Stated Pain Goal: 3 (21/97/58 8325)  Complications: No apparent anesthesia complications

## 2018-01-18 NOTE — Anesthesia Postprocedure Evaluation (Signed)
Anesthesia Post Note  Patient: Amanda Casey  Procedure(s) Performed: HYSTERECTOMY ABDOMINAL WITH SALPINGECTOMY (Bilateral Abdomen) CYSTOSCOPY (N/A Urethra)     Patient location during evaluation: PACU Anesthesia Type: Spinal Level of consciousness: awake and alert Pain management: pain level controlled Vital Signs Assessment: post-procedure vital signs reviewed and stable Respiratory status: spontaneous breathing Cardiovascular status: stable Postop Assessment: spinal receding Anesthetic complications: no    Last Vitals:  Vitals:   01/18/18 1307 01/18/18 1419  BP: 137/84 124/85  Pulse: 74 76  Resp: 18 18  Temp: 36.6 C 36.8 C  SpO2: 98% 98%    Last Pain:  Vitals:   01/18/18 1419  TempSrc: Oral  PainSc:    Pain Goal: Patients Stated Pain Goal: 3 (01/18/18 1406)               Nolon Nations

## 2018-01-18 NOTE — Op Note (Signed)
01/18/2018  11:22 AM  PATIENT:  Amanda Casey  48 y.o. female  PRE-OPERATIVE DIAGNOSIS:  Fibroids, anemia  POST-OPERATIVE DIAGNOSIS:  Fibroids, anemia  PROCEDURE:  Procedure(s): HYSTERECTOMY ABDOMINAL WITH SALPINGECTOMY (Bilateral) CYSTOSCOPY (N/A)  SURGEON:  Surgeon(s) and Role:    * Hulan Fray, Wilhemina Cash, MD - Primary    * Sloan Leiter, MD - Assisting   ANESTHESIA:   local and spinal  EBL:  400 mL   BLOOD ADMINISTERED:none  DRAINS: Urinary Catheter (Foley)   LOCAL MEDICATIONS USED:  MARCAINE     SPECIMEN:  Source of Specimen:  uterus and tubes  DISPOSITION OF SPECIMEN:  PATHOLOGY  COUNTS:  YES  TOURNIQUET:  * No tourniquets in log *  DICTATION: .Dragon Dictation  PLAN OF CARE: Admit to inpatient   PATIENT DISPOSITION:  PACU - hemodynamically stable.   Delay start of Pharmacological VTE agent (>24hrs) due to surgical blood loss or risk of bleeding: not applicable     The risks, benefits, and alternatives of surgery were explained, understood, and accepted. Consents were signed. All questions were answered. She was taken to the operating room and spinal anesthesia was applied without complication. Her abdomen and vagina were prepped and draped in the usual sterile fashion. A Foley catheter was placed which drained clear urine throughout the case. After adequate anesthesia was assured a transverse incision was made approximately 2 cm above her symphysis pubis after injecting 30 mL of 0.5% marcaine in the subcutaneous tissue. The incision was carried down through the subcutaneous tissue to the fascia. Bleeding encountered was cauterized with the Bovie. The fascia was scored the midline and the fascial incision was extended bilaterally. The pyramidalis muscles were separated in a transverse fashion using electrosurgical technique. Approximately 2 cm of the rectus muscles were separated in a transverse fashion in the midline using electrosurgical technique. Hemostasis was  maintained. The peritoneum was entered with hemostats and the peritoneal incision was extended bilaterally with the Bovie, taking care to avoid bowel and bladder. The patient was placed in Trendelenburg position and her bowel was packed out of the abdominal cavity. The pelvis was inspected. Her very large uterus filled the entire pelvis. I used towel clamps to elevate the uterus out of the incision. Coker clamps were used to elevate the uterus. The round ligaments were identified clamped cut and ligated. A bladder flap was created anteriorly and the bladder was pushed out of the operative site with a moist lap sponge. The uteroovarian ligaments were identified bilaterally. They were clamped, cut, and ligated. Excellent hemostasis was noted. 2-0 Vicryl sutures used throughout this case unless otherwise specified. The uterine vessels were skeletonized, clamped, cut, and doubly ligated. A bladder flap was created anteriorly. The remainder of the cervix was separated from its pelvic attachments using the same clamp, cut, ligate technique. Curved Heaney clamps were used to clamp beneath the cervix. The cervix and uterus were removed and sent to pathology. The vaginal cuff was noted to be hemostatic.  All pedicles were noted to be hemostatic. I placed Arista over the vaginal cuff. The ureters were noted to be functioning and of normal caliber. The sponges were removed from the pelvis. The rectus muscles were inspected and hemostasis was assured. The fascia was closed with a 0 Vicryl running nonlocking suture. The subcutaneous tissue was irrigated, clean, dry.  A subcuticular closure was done with 3-0 vicryl suture. I performed a cystoscopy using D10 for distension medium. Both ureteral orifices were noted to be effluxing urine.  She tolerated the procedure well and was taken to the recovery room in stable condition. Her Foley catheter drained clear urine throughout.

## 2018-01-18 NOTE — Anesthesia Preprocedure Evaluation (Addendum)
Anesthesia Evaluation  Patient identified by MRN, date of birth, ID band Patient awake    Reviewed: Allergy & Precautions, NPO status , Patient's Chart, lab work & pertinent test results  Airway Mallampati: II  TM Distance: >3 FB Neck ROM: Full    Dental no notable dental hx.    Pulmonary Current Smoker,    Pulmonary exam normal breath sounds clear to auscultation       Cardiovascular negative cardio ROS Normal cardiovascular exam Rhythm:Regular Rate:Normal     Neuro/Psych negative neurological ROS  negative psych ROS   GI/Hepatic negative GI ROS, Neg liver ROS,   Endo/Other  negative endocrine ROS  Renal/GU negative Renal ROS     Musculoskeletal negative musculoskeletal ROS (+)   Abdominal   Peds  Hematology  (+) anemia ,   Anesthesia Other Findings   Reproductive/Obstetrics negative OB ROS                             Anesthesia Physical Anesthesia Plan  ASA: II  Anesthesia Plan: Spinal   Post-op Pain Management:    Induction: Intravenous  PONV Risk Score and Plan: 4 or greater and Ondansetron, Dexamethasone, Midazolam and Scopolamine patch - Pre-op  Airway Management Planned:   Additional Equipment:   Intra-op Plan:   Post-operative Plan:   Informed Consent: I have reviewed the patients History and Physical, chart, labs and discussed the procedure including the risks, benefits and alternatives for the proposed anesthesia with the patient or authorized representative who has indicated his/her understanding and acceptance.   Dental advisory given  Plan Discussed with: CRNA  Anesthesia Plan Comments:        Anesthesia Quick Evaluation

## 2018-01-18 NOTE — Progress Notes (Signed)
This note also relates to the following rows which could not be included: Pulse Rate - Cannot attach notes to unvalidated device data ECG Heart Rate - Cannot attach notes to unvalidated device data Resp - Cannot attach notes to unvalidated device data BP - Cannot attach notes to unvalidated device data SpO2 - Cannot attach notes to unvalidated device data  Resting quietly

## 2018-01-18 NOTE — H&P (Signed)
Amanda Casey is an 47 y.o. female. Single P3 here today for TAH/BS due to symptomatic fibroids. She has had 2 transfusions for severe anemia, most recently last month for a hbg of 5. Her uterus is up to her umbilicus. Her EMBX and pap smear were normal. Her periods are not regular. She "bleeds all the time" since February.      Menstrual History: Menarche age: 73 No LMP recorded (lmp unknown).    Past Medical History:  Diagnosis Date  . Anemia   . History of blood transfusion 01/08/2018   [place]  . SVD (spontaneous vaginal delivery)    x 3    Past Surgical History:  Procedure Laterality Date  . TUBAL LIGATION      Family History  Problem Relation Age of Onset  . Diabetes Mellitus II Mother   . Hypertension Mother     Social History:  reports that she has been smoking cigarettes.  She has a 3.00 pack-year smoking history. She has never used smokeless tobacco. She reports that she drinks about 1.2 - 1.8 oz of alcohol per week. She reports that she does not use drugs.  Allergies: No Known Allergies  Medications Prior to Admission  Medication Sig Dispense Refill Last Dose  . acetaminophen (TYLENOL) 500 MG tablet Take 500 mg by mouth every 6 (six) hours as needed for moderate pain.   01/17/2018 at Unknown time  . ferrous sulfate (FERROUSUL) 325 (65 FE) MG tablet Take 1 tablet (325 mg total) by mouth 2 (two) times daily. 60 tablet 1 01/17/2018 at Unknown time    ROS Works at [employer] for 6 years  Blood pressure 120/76, pulse 80, temperature 98.5 F (36.9 C), temperature source Oral, resp. rate 16, SpO2 100 %. Physical Exam  Heart- rrr Lungs- CTAB Abd- benign Uterus palpable up to umbilicus  Results for orders placed or performed during the hospital encounter of 01/18/18 (from the past 24 hour(s))  Type and screen [place]     Status: None (Preliminary result)   Collection Time: 01/18/18  8:10 AM  Result Value Ref Range   ABO/RH(D) O POS     Antibody Screen PENDING    Sample Expiration      01/21/2018 Performed at [facility], [address], [place], [place] [address]   CBC     Status: Abnormal   Collection Time: 01/18/18  8:10 AM  Result Value Ref Range   WBC 7.0 4.0 - 10.5 K/uL   RBC 3.83 (L) 3.87 - 5.11 MIL/uL   Hemoglobin 9.4 (L) 12.0 - 15.0 g/dL   HCT 30.8 (L) 36.0 - 46.0 %   MCV 80.4 78.0 - 100.0 fL   MCH 24.5 (L) 26.0 - 34.0 pg   MCHC 30.5 30.0 - 36.0 g/dL   RDW 28.1 (H) 11.5 - 15.5 %   Platelets 336 150 - 400 K/uL    No results found.  Assessment/Plan: Symptomatic fibroids with anemia- plan for TAH/BS.   She understands the risks of surgery, including, but not to infection, bleeding, DVTs, damage to bowel, bladder, ureters. She wishes to proceed.     Emily Filbert 01/18/2018, 9:03 AM

## 2018-01-18 NOTE — Anesthesia Procedure Notes (Signed)
Spinal  Patient location during procedure: OR Staffing Performed: anesthesiologist  Preanesthetic Checklist Completed: patient identified, site marked, surgical consent, pre-op evaluation, timeout performed, IV checked, risks and benefits discussed and monitors and equipment checked Spinal Block Patient position: sitting Prep: site prepped and draped and DuraPrep Patient monitoring: heart rate, continuous pulse ox and blood pressure Approach: midline Location: L3-4 Injection technique: single-shot Needle Needle type: Sprotte  Needle gauge: 24 G Needle length: 9 cm Additional Notes Expiration date of kit checked and confirmed. Patient tolerated procedure well, without complications.       

## 2018-01-19 ENCOUNTER — Encounter (HOSPITAL_COMMUNITY): Payer: Self-pay | Admitting: Obstetrics & Gynecology

## 2018-01-19 LAB — CBC
HCT: 25.4 % — ABNORMAL LOW (ref 36.0–46.0)
Hemoglobin: 8.1 g/dL — ABNORMAL LOW (ref 12.0–15.0)
MCH: 25.7 pg — AB (ref 26.0–34.0)
MCHC: 31.9 g/dL (ref 30.0–36.0)
MCV: 80.6 fL (ref 78.0–100.0)
PLATELETS: 305 10*3/uL (ref 150–400)
RBC: 3.15 MIL/uL — ABNORMAL LOW (ref 3.87–5.11)
RDW: 28.1 % — AB (ref 11.5–15.5)
WBC: 17.8 10*3/uL — ABNORMAL HIGH (ref 4.0–10.5)

## 2018-01-19 MED ORDER — IBUPROFEN 800 MG PO TABS: 800.0000 mg | ORAL_TABLET | Freq: Three times a day (TID) | ORAL | 0 refills | Status: DC | PRN

## 2018-01-19 MED ORDER — OXYCODONE-ACETAMINOPHEN 5-325 MG PO TABS: 1.0000 | ORAL_TABLET | ORAL | 0 refills | Status: DC | PRN

## 2018-01-19 NOTE — Progress Notes (Signed)
1 Day Post-Op Procedure(s) (LRB): HYSTERECTOMY ABDOMINAL WITH SALPINGECTOMY (Bilateral) CYSTOSCOPY (N/A)  Subjective: Patient reports incisional pain, tolerating PO and no problems voiding.    Objective: I have reviewed patient's vital signs, intake and output, medications and labs.  General: alert Resp: clear to auscultation bilaterally Cardio: regular rate and rhythm, S1, S2 normal, no murmur, click, rub or gallop GI: soft, non-tender; bowel sounds normal; no masses,  no organomegaly Honeycomb dressing: clean, dry, intact Assessment: s/p Procedure(s): HYSTERECTOMY ABDOMINAL WITH SALPINGECTOMY (Bilateral) CYSTOSCOPY (N/A): progressing well  Plan: Encourage ambulation  LOS: 1 day    Derwood Becraft C Zyren Sevigny 01/19/2018, 12:00 PM

## 2018-01-19 NOTE — Discharge Instructions (Signed)
Abdominal Hysterectomy, Care After °This sheet gives you information about how to care for yourself after your procedure. Your doctor may also give you more specific instructions. If you have problems or questions, contact your doctor. °Follow these instructions at home: °Bathing °· Do not take baths, swim, or use a hot tub until your doctor says it is okay. Ask your doctor if you can take showers. You may only be allowed to take sponge baths for bathing. °· Keep the bandage (dressing) dry until your doctor says it can be taken off. °Surgical cut ( °incision) care °· Follow instructions from your doctor about how to take care of your cut from surgery. Make sure you: °? Wash your hands with soap and water before you change your bandage (dressing). If you cannot use soap and water, use hand sanitizer. °? Change your bandage as told by your doctor. °? Leave stitches (sutures), skin glue, or skin tape (adhesive) strips in place. They may need to stay in place for 2 weeks or longer. If tape strips get loose and curl up, you may trim the loose edges. Do not remove tape strips completely unless your doctor says it is okay. °· Check your surgical cut area every day for signs of infection. Check for: °? Redness, swelling, or pain. °? Fluid or blood. °? Warmth. °? Pus or a bad smell. °Activity °· Do gentle, daily exercise as told by your doctor. You may be told to take short walks every day and go farther each time. °· Do not lift anything that is heavier than 10 lb (4.5 kg), or the limit that your doctor tells you, until he or she says that it is safe. °· Do not drive or use heavy machinery while taking prescription pain medicine. °· Do not drive for 24 hours if you were given a medicine to help you relax (sedative). °· Follow your doctor's advice about exercise, driving, and general activities. Ask your doctor what activities are safe for you. °Lifestyle °· Do not douche, use tampons, or have sex for at least 6 weeks or as  told by your doctor. °· Do not drink alcohol until your doctor says it is okay. °· Drink enough fluid to keep your pee (urine) clear or pale yellow. °· Try to have someone at home with you for the first 1-2 weeks to help. °· Do not use any products that contain nicotine or tobacco, such as cigarettes and e-cigarettes. These can slow down healing. If you need help quitting, ask your doctor. °General instructions °· Take over-the-counter and prescription medicines only as told by your doctor. °· Do not take aspirin or ibuprofen. These medicines can cause bleeding. °· To prevent or treat constipation while you are taking prescription pain medicine, your doctor may suggest that you: °? Drink enough fluid to keep your urine clear or pale yellow. °? Take over-the-counter or prescription medicines. °? Eat foods that are high in fiber, such as: °§ Fresh fruits and vegetables. °§ Whole grains. °§ Beans. °? Limit foods that are high in fat and processed sugars, such as fried and sweet foods. °· Keep all follow-up visits as told by your doctor. This is important. °Contact a doctor if: °· You have chills or fever. °· You have redness, swelling, or pain around your cut. °· You have fluid or blood coming from your cut. °· Your cut feels warm to the touch. °· You have pus or a bad smell coming from your cut. °· Your cut breaks   open. °· You feel dizzy or light-headed. °· You have pain or bleeding when you pee. °· You keep having watery poop (diarrhea). °· You keep feeling sick to your stomach (nauseous) or keep throwing up (vomiting). °· You have unusual fluid (discharge) coming from your vagina. °· You have a rash. °· You have a reaction to your medicine. °· Your pain medicine does not help. °Get help right away if: °· You have a fever and your symptoms get worse all of a sudden. °· You have very bad belly (abdominal) pain. °· You are short of breath. °· You pass out (faint). °· You have pain, swelling, or redness of your  leg. °· You bleed a lot from your vagina and notice clumps of blood (clots). °Summary °· Do not take baths, swim, or use a hot tub until your doctor says it is okay. Ask your doctor if you can take showers. You may only be allowed to take sponge baths for bathing. °· Follow your doctor's advice about exercise, driving, and general activities. Ask your doctor what activities are safe for you. °· Do not lift anything that is heavier than 10 lb (4.5 kg), or the limit that your doctor tells you, until he or she says that it is safe. °· Try to have someone at home with you for the first 1-2 weeks to help. °This information is not intended to replace advice given to you by your health care provider. Make sure you discuss any questions you have with your health care provider. °Document Released: 05/05/2008 Document Revised: 07/15/2016 Document Reviewed: 07/15/2016 °Elsevier Interactive Patient Education © 2017 [employer] ° °

## 2018-01-19 NOTE — Addendum Note (Signed)
Addendum  created 01/19/18 1044 by Ruchama Kubicek, Waynard Reeds, CRNA   Sign clinical note

## 2018-01-19 NOTE — Anesthesia Postprocedure Evaluation (Signed)
Anesthesia Post Note  Patient: Amanda Casey  Procedure(s) Performed: HYSTERECTOMY ABDOMINAL WITH SALPINGECTOMY (Bilateral Abdomen) CYSTOSCOPY (N/A Urethra)     Patient location during evaluation: [facility] Anesthesia Type: Spinal Level of consciousness: awake, awake and alert and oriented Pain management: pain level controlled Vital Signs Assessment: post-procedure vital signs reviewed and stable Respiratory status: spontaneous breathing, nonlabored ventilation and respiratory function stable Cardiovascular status: stable Postop Assessment: no headache, no backache, patient able to bend at knees, no apparent nausea or vomiting, adequate PO intake and able to ambulate Anesthetic complications: no    Last Vitals:  Vitals:   01/19/18 0402 01/19/18 0755  BP: 104/72 118/70  Pulse: 75 66  Resp: 16 16  Temp: 37.1 C 37 C  SpO2: 99% 100%    Last Pain:  Vitals:   01/19/18 0802  TempSrc:   PainSc: 4    Pain Goal: Patients Stated Pain Goal: 3 (01/19/18 3016)               Marcy Siren

## 2018-01-20 NOTE — Progress Notes (Signed)
Discharge teaching complete with pt. Pt understood all information and did not have any questions. Pt discharged home to family. 

## 2018-01-20 NOTE — Discharge Summary (Signed)
Physician Discharge Summary  Patient ID: Amanda Casey MRN: [id number] DOB/AGE: [birth year] 47 y.o.  Admit date: 01/18/2018 Discharge date: 01/20/2018  Admission Diagnoses: symptomatic fibroids, anemia  Discharge Diagnoses: same Active Problems:   Post-operative state   Discharged Condition: good  Hospital Course: She underwent an uncomplicated TAH/BS with spinal anesthesia. By POD #2 she was voiding, having a BM, tolerating po well, having flatus, and voiced her readiness to go home.  Consults: None  Significant Diagnostic Studies: labs: pre op hbg was 9.4, post op 8.1  Treatments: surgery: as above  Discharge Exam: Blood pressure 118/83, pulse 70, temperature 98.7 F (37.1 C), temperature source Oral, resp. rate 18, height 5' 2.52" (1.588 m), weight 68 kg (150 lb), SpO2 100 %. General appearance: alert Resp: clear to auscultation bilaterally Cardio: regular rate and rhythm, S1, S2 normal, no murmur, click, rub or gallop GI: soft, non-tender; bowel sounds normal; no masses,  no organomegaly Incision/Wound: honeycomb dressing: clean, dry, and intact  Disposition: Discharge disposition: 01-Home or Self Care        Allergies as of 01/20/2018   No Known Allergies     Medication List    STOP taking these medications   acetaminophen 500 MG tablet Commonly known as:  TYLENOL     TAKE these medications   ferrous sulfate 325 (65 FE) MG tablet Commonly known as:  FERROUSUL Take 1 tablet (325 mg total) by mouth 2 (two) times daily.   ibuprofen 800 MG tablet Commonly known as:  ADVIL,MOTRIN Take 1 tablet (800 mg total) by mouth every 8 (eight) hours as needed (mild pain).   oxyCODONE-acetaminophen 5-325 MG tablet Commonly known as:  PERCOCET/ROXICET Take 1 tablet by mouth every 3 (three) hours as needed for moderate pain.      miralax or other stool softener of choice  Follow-up Information    Celestine Bougie C, MD. Schedule an appointment as soon as possible for  a visit in 6 week(s).   Specialty:  Obstetrics and Gynecology Contact information: [address] [place] [address] [phone]           Signed: Emily Filbert 01/20/2018, 5:07 PM

## 2018-03-03 ENCOUNTER — Encounter: Payer: Self-pay | Admitting: Obstetrics & Gynecology

## 2018-03-03 ENCOUNTER — Ambulatory Visit (INDEPENDENT_AMBULATORY_CARE_PROVIDER_SITE_OTHER): Payer: Self-pay | Admitting: Obstetrics & Gynecology

## 2018-03-03 VITALS — BP 129/85 | HR 74 | Wt 150.7 lb

## 2018-03-03 DIAGNOSIS — D649 Anemia, unspecified: Secondary | ICD-10-CM

## 2018-03-03 DIAGNOSIS — Z9889 Other specified postprocedural states: Secondary | ICD-10-CM

## 2018-03-03 NOTE — Progress Notes (Signed)
   Subjective:    Patient ID: Amanda Casey, female    DOB: 03/25/1971, 47 y.o.   MRN: [id number]  HPI  47 yo single P3 here for a post op visit. She had a TAH/BS for a 1400 gram uterus. On 6-11-19She has no problems, having normal bowel and bladder function. She is ready to go back to work.    Review of Systems Pathology was benign.    Objective:   Physical Exam  Breathing, conversing, and ambulating normally Well nourished, well hydrated Black female, no apparent distress Abd- benign Incision- healed beautifully Vaginal cuff- well-healed Bimanual exam -no masses or tenderness     Assessment & Plan:  Post op- doing well Come back 1 year Check CBC to follow up on anemia.

## 2018-03-04 LAB — CBC
Hematocrit: 40.4 % (ref 34.0–46.6)
Hemoglobin: 12.8 g/dL (ref 11.1–15.9)
MCH: 26.6 pg (ref 26.6–33.0)
MCHC: 31.7 g/dL (ref 31.5–35.7)
MCV: 84 fL (ref 79–97)
PLATELETS: 472 10*3/uL — AB (ref 150–450)
RBC: 4.81 x10E6/uL (ref 3.77–5.28)
RDW: 19.7 % — ABNORMAL HIGH (ref 12.3–15.4)
WBC: 9.6 10*3/uL (ref 3.4–10.8)

## 2018-05-21 IMAGING — US US ART/VEN ABD/PELV/SCROTUM DOPPLER LTD
1 series · 13 of 25 positions shown · non-contrast
Comparison: None.

CLINICAL DATA: Initial evaluation for acute vaginal bleeding.

EXAM:
TRANSABDOMINAL ULTRASOUND OF PELVIS
DOPPLER ULTRASOUND OF OVARIES
TECHNIQUE: Transabdominal ultrasound examination of the pelvis was performed
including evaluation of the uterus, ovaries, adnexal regions, and
pelvic cul-de-sac.
Color and duplex Doppler ultrasound was utilized to evaluate blood
flow to the ovaries.

[Series 1: us art/ven abd/pelv/scrotum doppler ltd · 0.34mm/px · 80 acquisitions, 13 frames shown]
[im 1/80]
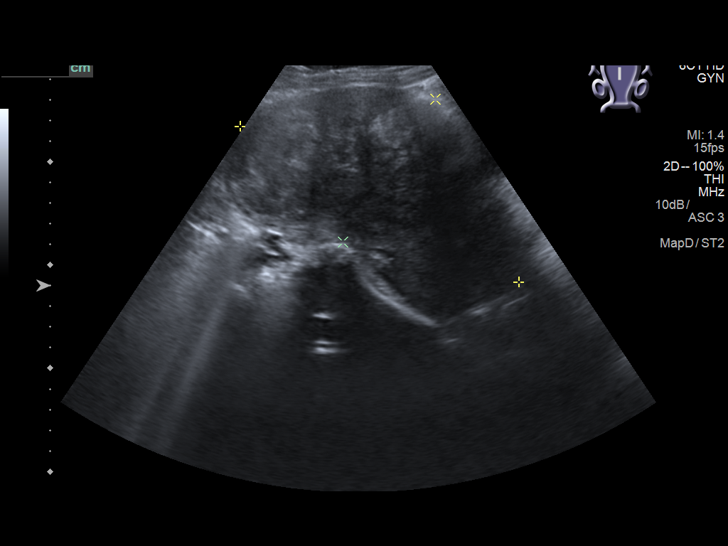
[im 7/80]
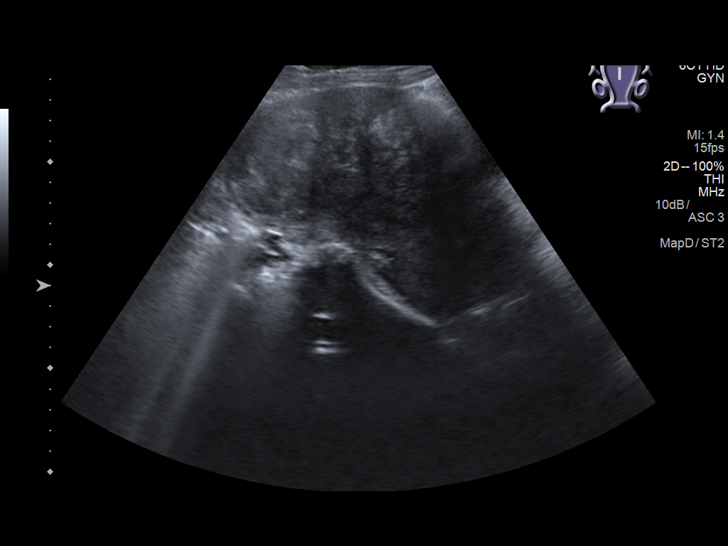
[im 14/80]
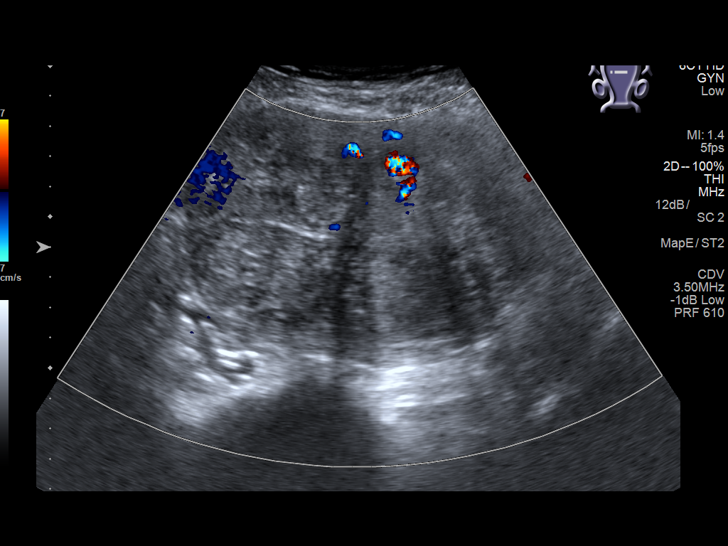
[im 20/80]
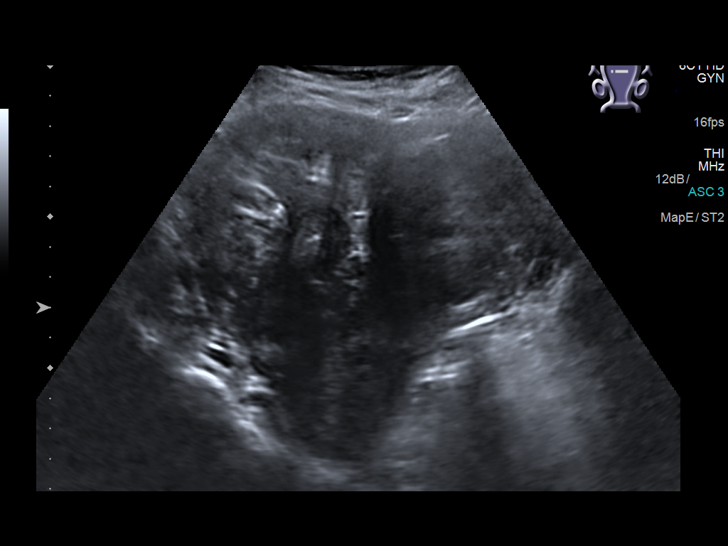
[im 27/80]
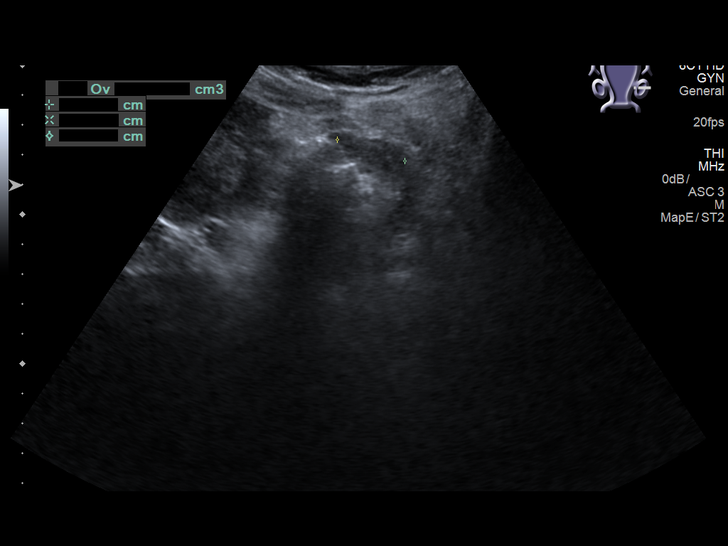
[im 33/80]
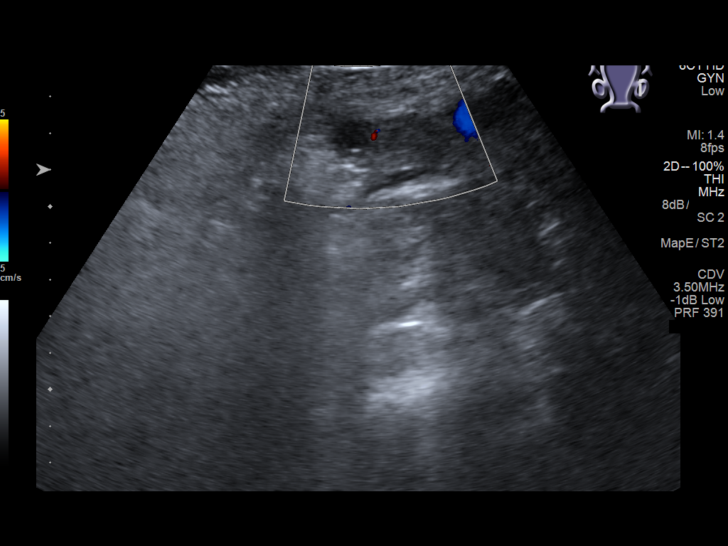
[im 40/80]
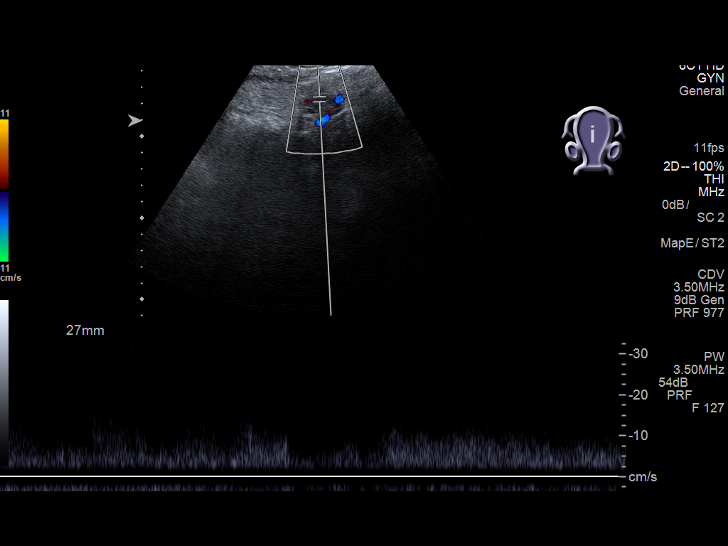
[im 47/80]
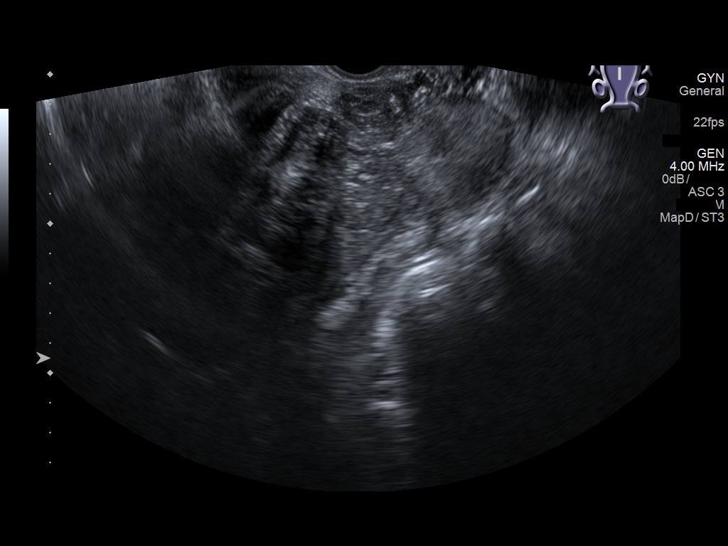
[im 53/80]
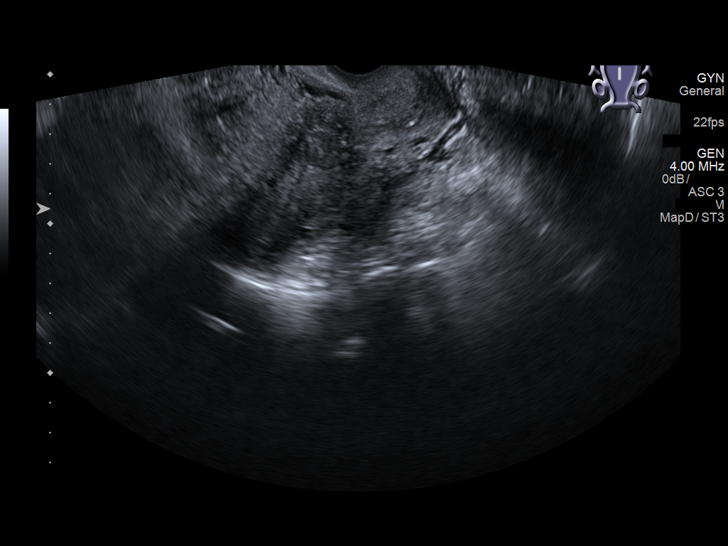
[im 60/80]
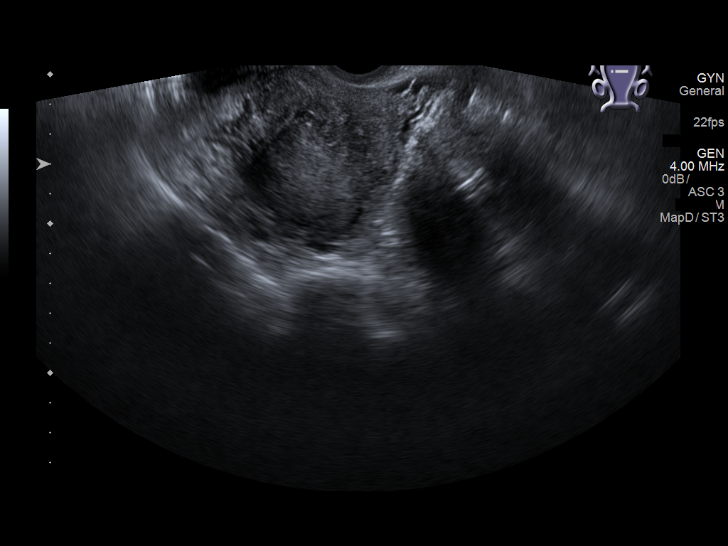
[im 66/80]
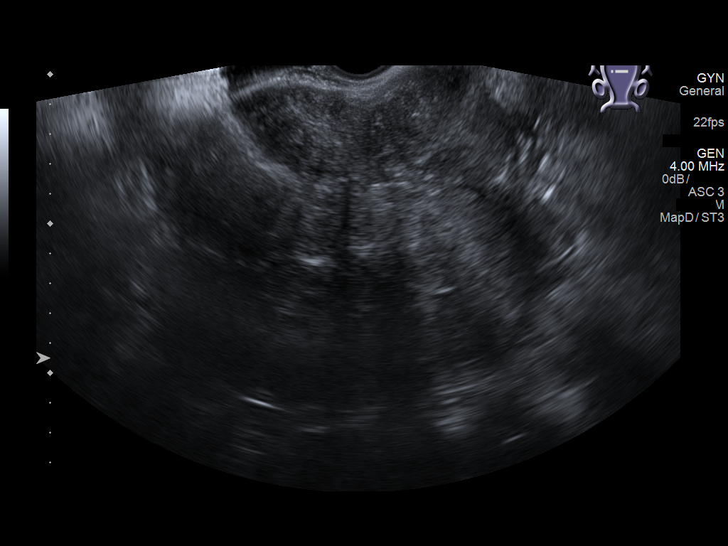
[im 73/80]
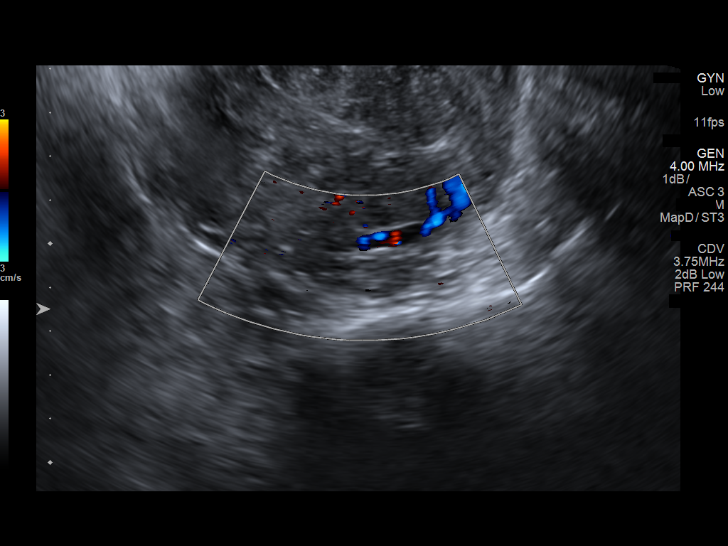
[im 80/80]
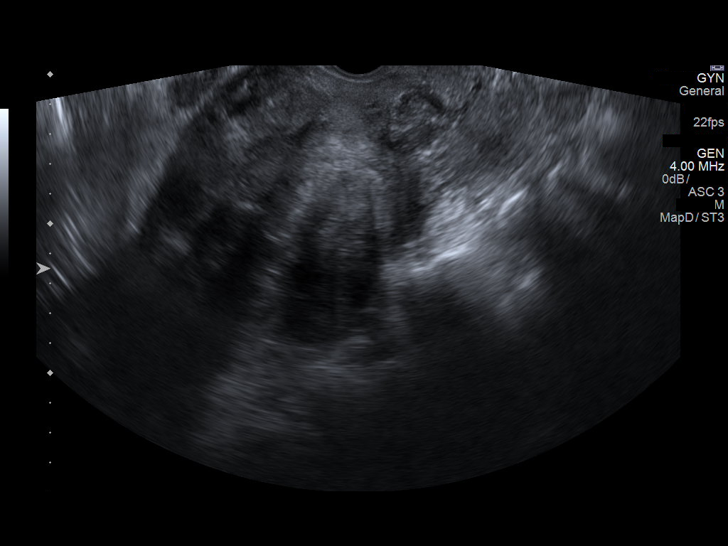

[13 of 25 positions shown; findings below may reference images not displayed]

FINDINGS: Uterus

Measurements: 15.5 x 9.5 x 12.0 cm. Multiple prominent fibroids are
seen throughout the uterus. 4.5 x 5.0 x 4.3 cm intramural fibroid
present at the uterine fundus. 5.0 x 6.6 x 5.7 cm intramural fibroid
present at the left uterine body. 6.5 x 6.7 x 6.2 cm intramural
fibroid present at the right mid uterine body. 4.5 x 6.2 x 6.6 cm
intramural fibroid present within the lower right uterine body.

Endometrium

Thickness: 7 mm.  No focal abnormality visualized.

Right ovary

Measurements: 2.7 x 1.0 x 2.0 cm. Normal appearance/no adnexal mass.

Left ovary

Measurements: 4.2 x 1.3 x 2.7 cm. Normal appearance/no adnexal mass.

Pulsed Doppler evaluation demonstrates normal low-resistance
arterial and venous waveforms in the left ovary. Venous waveform
seen within the right ovary, with a discrete arterial waveform
poorly visualized, likely due to technique. No secondary signs to
suggest torsion identified.
IMPRESSION: 1. Enlarged fibroid uterus as above.
2. No other acute abnormality within the pelvis. Normal sonographic
appearance of the ovaries. No convincing sonographic evidence for
ovarian torsion with limitations as above.

## 2019-04-01 IMAGING — DX DG CHEST 2V
2 series · 2 of 2 positions shown · non-contrast
Comparison: None.

CLINICAL DATA: Dizziness with cough

EXAM:
CHEST  2 VIEW

[chest pa]
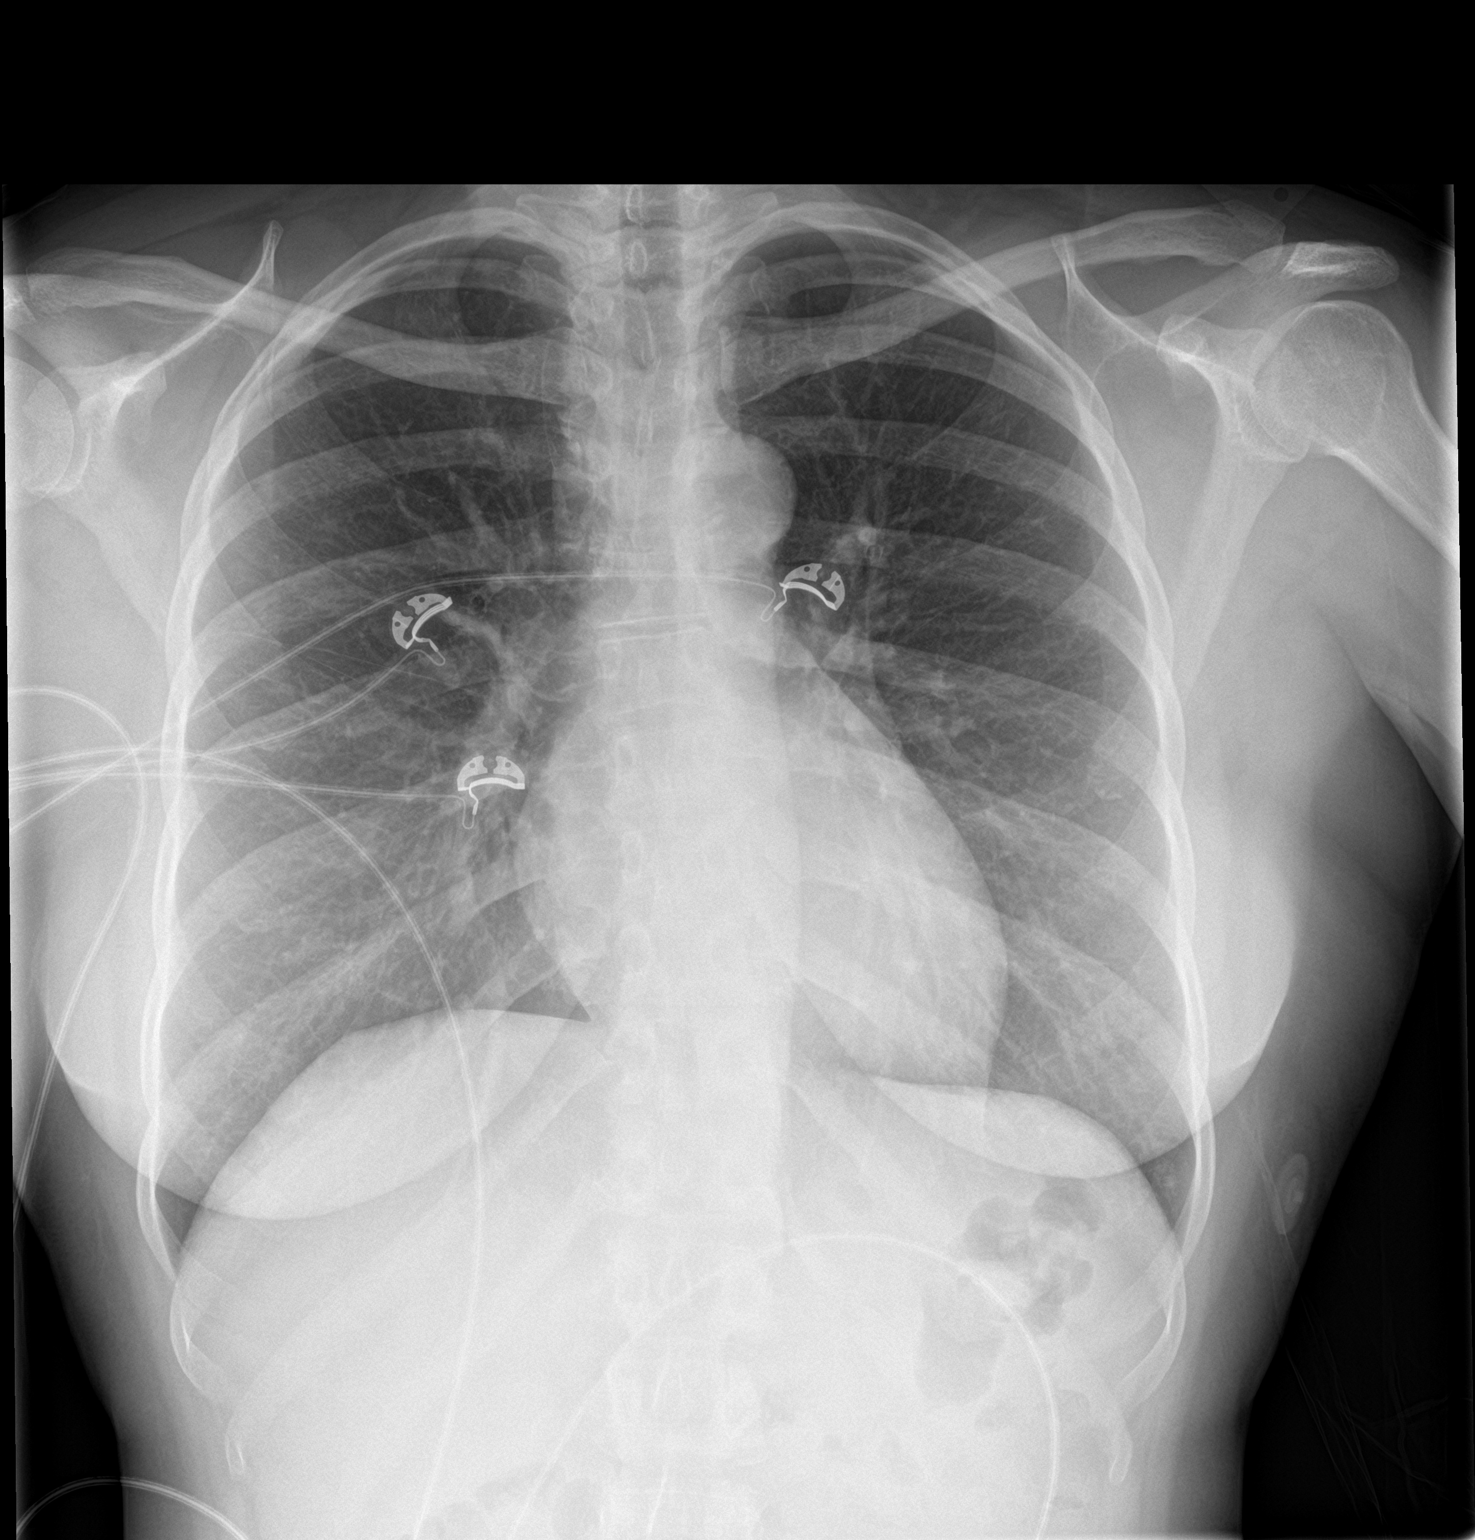

[chest lat]
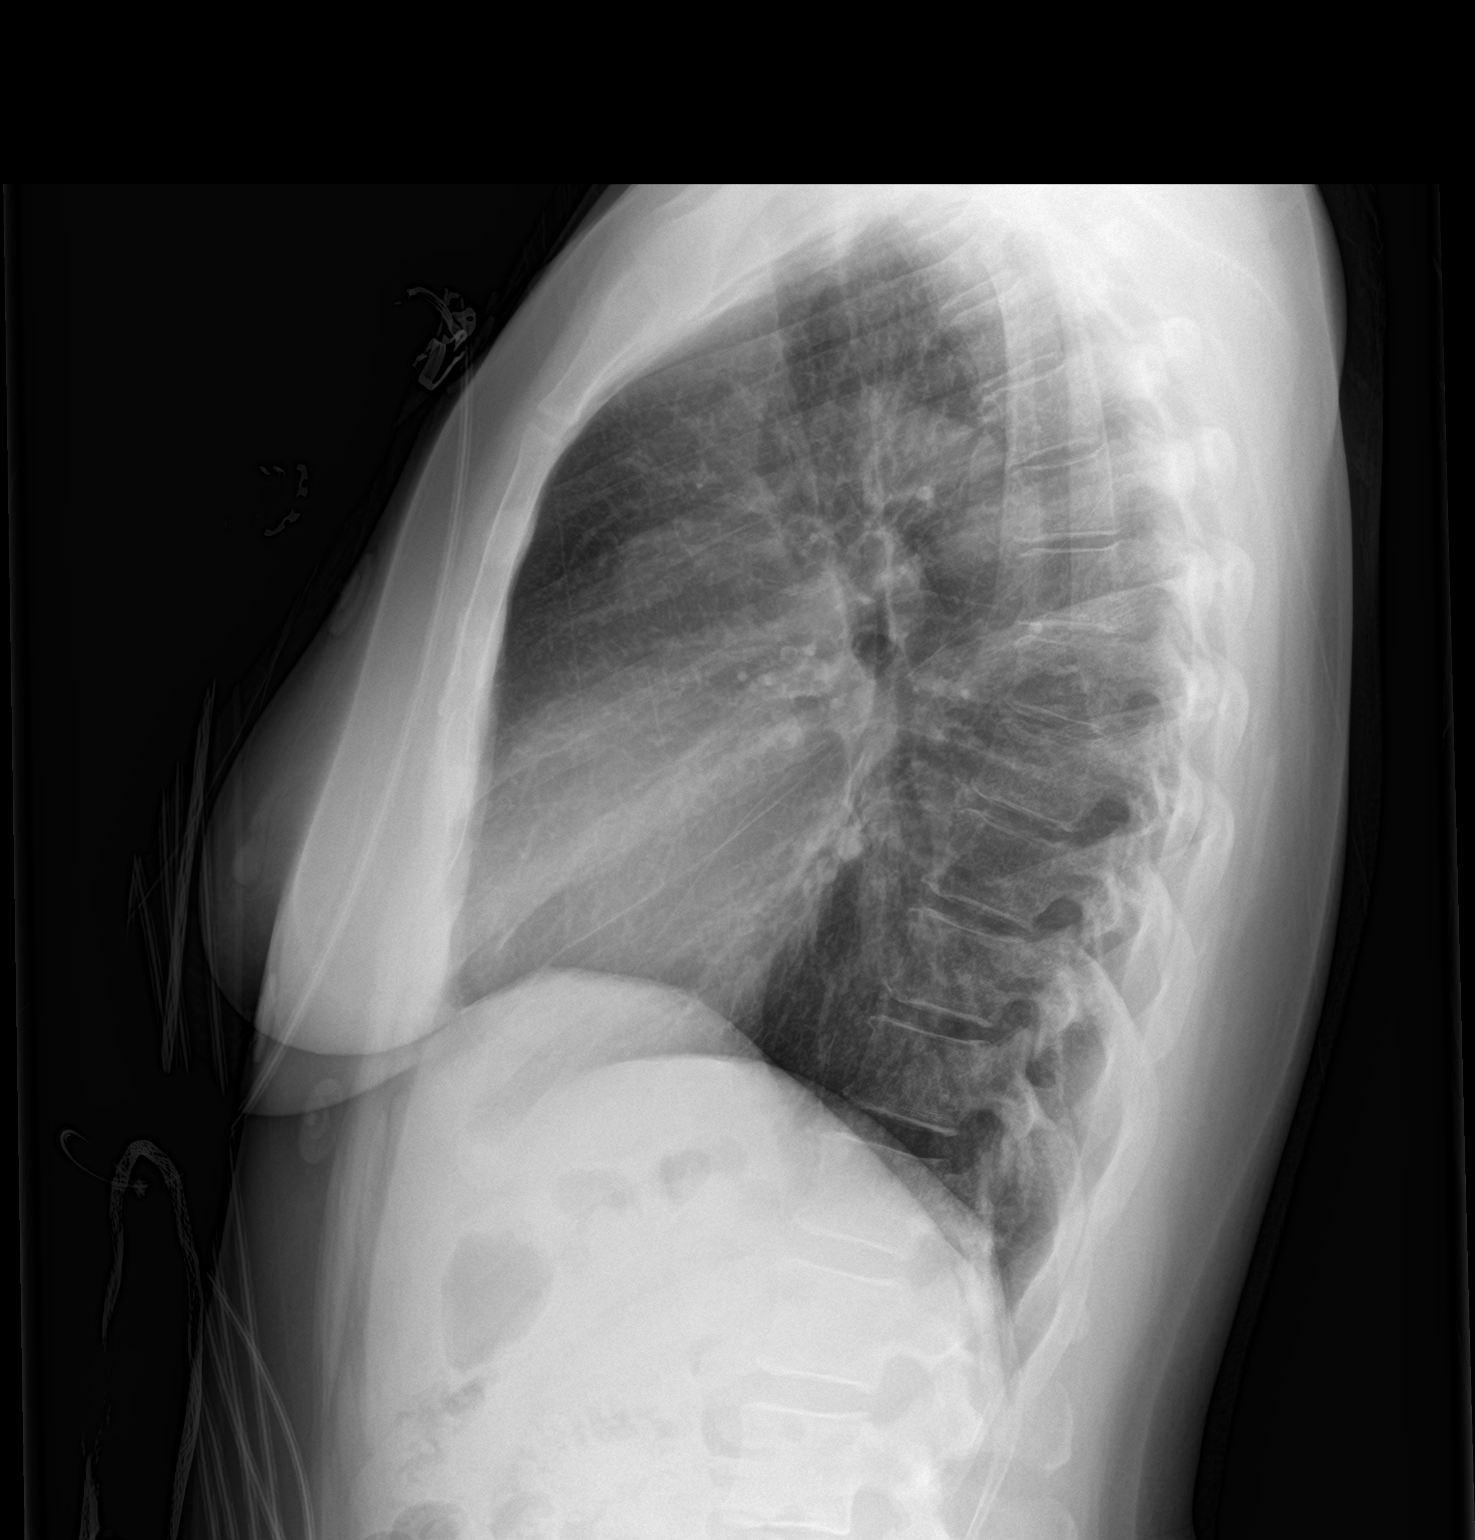

[2 of 2 positions shown; findings below may reference images not displayed]

FINDINGS: Lungs are clear. Heart size and pulmonary vascularity are normal. No
adenopathy. No pneumothorax. No bone lesions. There is a small
azygos lobe on the right, an anatomic variant.
IMPRESSION: No edema or consolidation.

## 2019-11-01 ENCOUNTER — Ambulatory Visit (HOSPITAL_COMMUNITY)
Admission: EM | Admit: 2019-11-01 | Discharge: 2019-11-01 | Disposition: A | Discharge status: Home / Self Care | Payer: Self-pay | Attending: Family Medicine | Admitting: Family Medicine

## 2019-11-01 ENCOUNTER — Encounter (HOSPITAL_COMMUNITY): Payer: Self-pay | Admitting: Emergency Medicine

## 2019-11-01 ENCOUNTER — Other Ambulatory Visit: Payer: Self-pay

## 2019-11-01 ENCOUNTER — Ambulatory Visit (INDEPENDENT_AMBULATORY_CARE_PROVIDER_SITE_OTHER): Payer: Self-pay

## 2019-11-01 DIAGNOSIS — M25461 Effusion, right knee: Secondary | ICD-10-CM

## 2019-11-01 DIAGNOSIS — M25462 Effusion, left knee: Secondary | ICD-10-CM

## 2019-11-01 DIAGNOSIS — M25562 Pain in left knee: Secondary | ICD-10-CM

## 2019-11-01 DIAGNOSIS — M25561 Pain in right knee: Secondary | ICD-10-CM

## 2019-11-01 MED ORDER — PREDNISONE 20 MG PO TABS: ORAL_TABLET | ORAL | 0 refills | Status: DC

## 2019-11-01 NOTE — ED Provider Notes (Signed)
[facility]    CSN: [id number] Arrival date & time: 11/01/19  1427      History   Chief Complaint Chief Complaint  Patient presents with  . Fall  . Knee Pain    HPI Amanda Casey is a 49 y.o. female.   Initial [facility] patient visit  [place] on left knee 2 weeks ago. Swelling and pain.   Right knee surgery 15 years ago. Right knee has been painful since fall as well.  Patient states that her boyfriend died last month from complications of diabetes.  While he was bedridden, she was having to care for him and put a lot of stress on her knees.  She has had chronic right knee swelling since her surgery, but it worsened after the fall 2 weeks ago when she had to rely on her right knee more.  Patient did have physical therapy after her right knee surgery but it never regained its function and has always been swollen.  The left knee swelling is new.  There is no external tenderness but there is quite a bit of swelling.     Past Medical History:  Diagnosis Date  . Anemia   . History of blood transfusion 01/08/2018   [facility]  . SVD (spontaneous vaginal delivery)    x 3    Patient Active Problem List   Diagnosis Date Noted  . Post-operative state 01/18/2018  . Anemia 01/07/2018  . Symptomatic anemia 10/06/2017  . Microcytic hypochromic anemia 10/06/2017    Past Surgical History:  Procedure Laterality Date  . CYSTOSCOPY N/A 01/18/2018   Procedure: CYSTOSCOPY;  Surgeon: Emily Filbert, MD;  Location: [facility];  Service: Gynecology;  Laterality: N/A;  . HYSTERECTOMY ABDOMINAL WITH SALPINGECTOMY Bilateral 01/18/2018   Procedure: HYSTERECTOMY ABDOMINAL WITH SALPINGECTOMY;  Surgeon: Emily Filbert, MD;  Location: [facility];  Service: Gynecology;  Laterality: Bilateral;  . TUBAL LIGATION      OB History   No obstetric history on file.      Home Medications    Prior to Admission medications   Medication Sig Start Date End Date Taking? Authorizing Provider  ferrous sulfate  (FERROUSUL) 325 (65 FE) MG tablet Take 1 tablet (325 mg total) by mouth 2 (two) times daily. 01/08/18   Constant, Peggy, MD  predniSONE (DELTASONE) 20 MG tablet Two daily with food 11/01/19   Robyn Haber, MD    Family History Family History  Problem Relation Age of Onset  . Diabetes Mellitus II Mother   . Hypertension Mother     Social History Social History   Tobacco Use  . Smoking status: Current Every Day Smoker    Packs/day: 0.50    Years: 6.00    Pack years: 3.00    Types: Cigarettes  . Smokeless tobacco: Never Used  Substance Use Topics  . Alcohol use: Yes    Alcohol/week: 2.0 - 3.0 standard drinks    Types: 1 Cans of beer, 1 - 2 Shots of liquor per week    Comment: weekends  . Drug use: No     Allergies   Patient has no known allergies.   Review of Systems Review of Systems  Musculoskeletal: Positive for gait problem and joint swelling.  All other systems reviewed and are negative.    Physical Exam Triage Vital Signs ED Triage Vitals [11/01/19 1443]  Enc Vitals Group     BP      Pulse      Resp  Temp      Temp src      SpO2      Weight      Height      Head Circumference      Peak Flow      Pain Score 8     Pain Loc      Pain Edu?      Excl. in [place]?    No data found.  Updated Vital Signs BP (!) 174/109   Pulse 71   Temp 98.1 F (36.7 C) (Oral)   Resp 16   LMP  (LMP Unknown) Comment: cont. bleeding since Feb 2019  SpO2 99%    Physical Exam Vitals and nursing note reviewed.  Constitutional:      General: She is not in acute distress.    Appearance: Normal appearance. She is normal weight. She is not ill-appearing or toxic-appearing.  HENT:     Head: Normocephalic.  Cardiovascular:     Rate and Rhythm: Normal rate.  Pulmonary:     Effort: Pulmonary effort is normal.  Musculoskeletal:        General: Swelling and signs of injury present. No tenderness or deformity. Normal range of motion.     Cervical back: Normal range of  motion and neck supple.     Comments: Both knees have a very large effusion, worse on the right.  There is no localized tenderness.  She is able to flex the knees to 90 degrees bilaterally.  Skin:    General: Skin is warm and dry.  Neurological:     General: No focal deficit present.     Mental Status: She is alert and oriented to person, place, and time.  Psychiatric:        Mood and Affect: Mood normal.        Behavior: Behavior normal.        Thought Content: Thought content normal.        Judgment: Judgment normal.      UC Treatments / Results  Labs (all labs ordered are listed, but only abnormal results are displayed) Labs Reviewed - No data to display  EKG   Radiology DG Knee Complete 4 Views Left  Result Date: 11/01/2019 CLINICAL DATA:  [place] on left knee 2 weeks ago.  Complaining of pain. EXAM: LEFT KNEE - COMPLETE 4+ VIEW COMPARISON:  None. FINDINGS: No fracture or bone lesion. Knee joint normally spaced and aligned.  No arthropathic changes. Small joint effusion. Mild anterior subcutaneous soft tissue edema. IMPRESSION: 1. No fracture, bone lesion or knee joint arthropathic changes. 2. Mild anterior subcutaneous soft tissue edema. Small knee joint effusion. Electronically Signed   By: Lajean Manes M.D.   On: 11/01/2019 15:26   DG Knee Complete 4 Views Right  Result Date: 11/01/2019 CLINICAL DATA:  [place] on right knee 2 weeks ago. Complaining of pain. History of previous knee surgery. EXAM: RIGHT KNEE - COMPLETE 4+ VIEW COMPARISON:  11/28/2008 FINDINGS: No fracture or bone lesion. Status post ACL reconstruction with femoral and tibial tunnels, and interference screw the anterior proximal tibia. These findings are stable. Minor marginal osteophytes from the mediolateral compartments and from the patella. No other degenerative/arthropathic change. No convincing joint effusion. Soft tissues are unremarkable. IMPRESSION: 1. No fracture or acute finding. 2. Mild degenerative  changes. Chronic changes from previous ACL reconstruction. Electronically Signed   By: Lajean Manes M.D.   On: 11/01/2019 15:25    Procedures Procedures (including critical care time)  Medications  Ordered in UC Medications - No data to display  Initial Impression / Assessment and Plan / UC Course  I have reviewed the triage vital signs and the nursing notes.  Pertinent labs & imaging results that were available during my care of the patient were reviewed by me and considered in my medical decision making (see chart for details).    Final Clinical Impressions(s) / UC Diagnoses   Final diagnoses:  Knee effusion, left  Knee effusion, right     Discharge Instructions     The right knee shows that you have an ACL repair 15 years ago.  There is quite a bit of swelling in both knees suggesting that you are developing arthritis.  I am prescribing strong anti-inflammatory medicine to get you through this painful time.  You are going to need to have a long-term plan for this problem.  Therefore please make an appointment with community health and wellness to start the healing process for the long-term.    ED Prescriptions    Medication Sig Dispense Auth. Provider   predniSONE (DELTASONE) 20 MG tablet Two daily with food 10 tablet Robyn Haber, MD     PDMP not reviewed this encounter.   Robyn Haber, MD 11/01/19 1539

## 2019-11-01 NOTE — Discharge Instructions (Signed)
The right knee shows that you have an ACL repair 15 years ago.  There is quite a bit of swelling in both knees suggesting that you are developing arthritis.  I am prescribing strong anti-inflammatory medicine to get you through this painful time.  You are going to need to have a long-term plan for this problem.  Therefore please make an appointment with community health and wellness to start the healing process for the long-term.

## 2019-11-01 NOTE — ED Triage Notes (Signed)
Fell on left knee 2 weeks ago. Swelling and pain.   Right knee surgery 15 years ago. Right knee has been painful since fall as well.

## 2021-04-26 IMAGING — DX DG KNEE COMPLETE 4+V*L*
4 series · 4 of 4 positions shown · non-contrast
Comparison: None.

CLINICAL DATA: Fell on left knee 2 weeks ago.  Complaining of pain.

EXAM:
LEFT KNEE - COMPLETE 4+ VIEW

[knee ap]
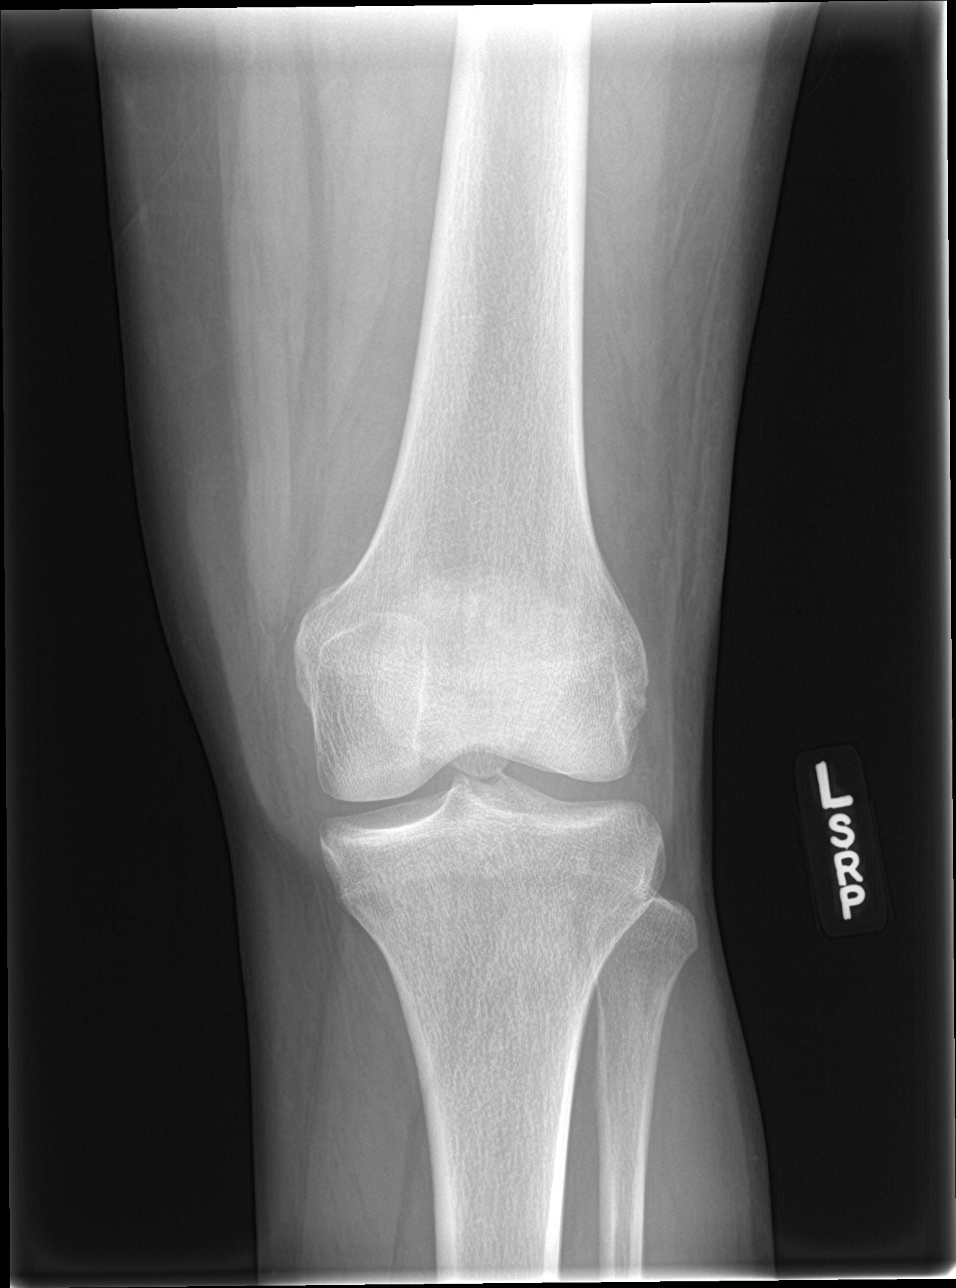

[knee obl (1 of 2)]
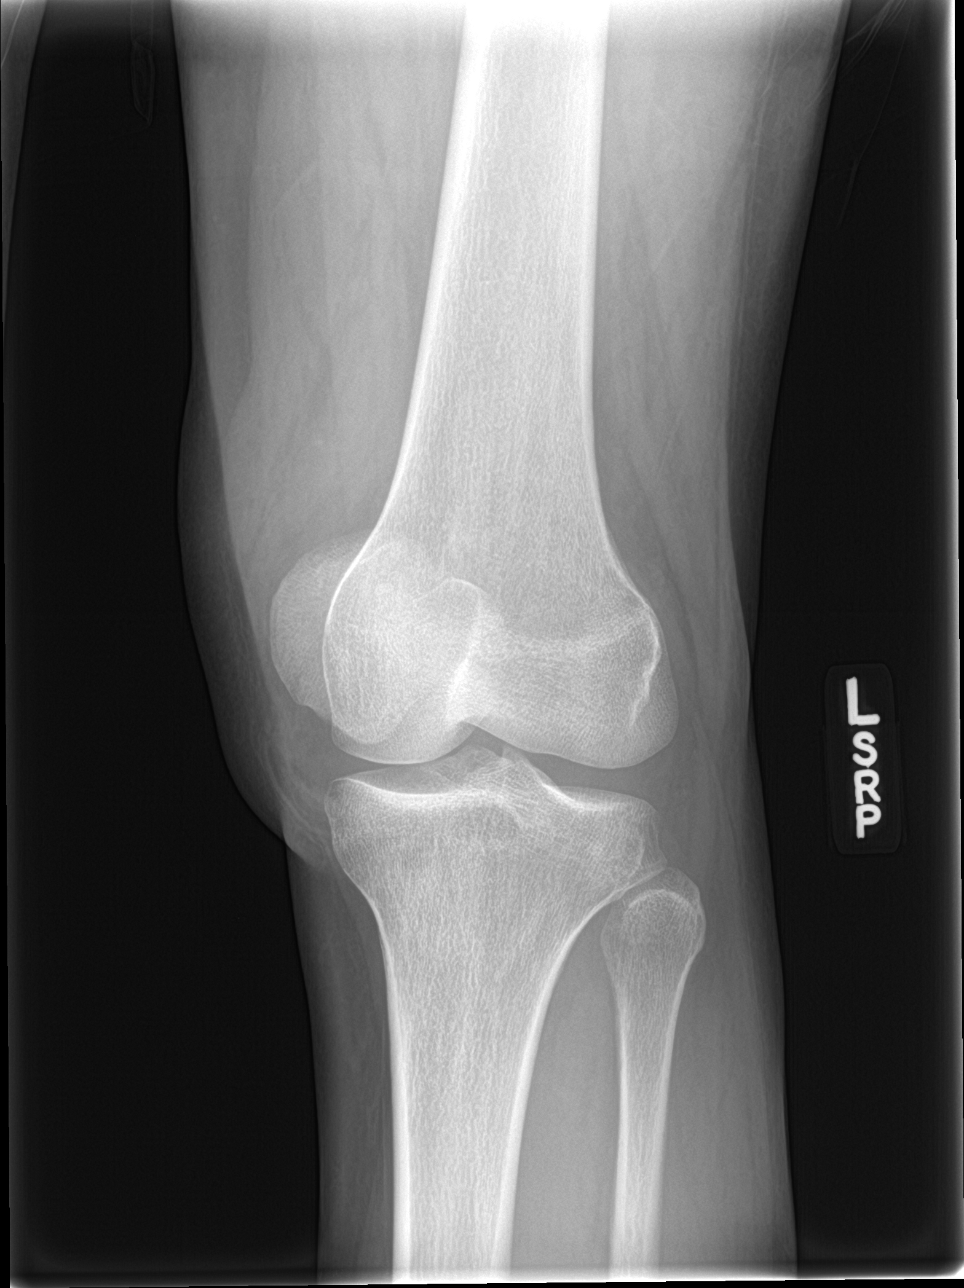

[knee obl (2 of 2)]
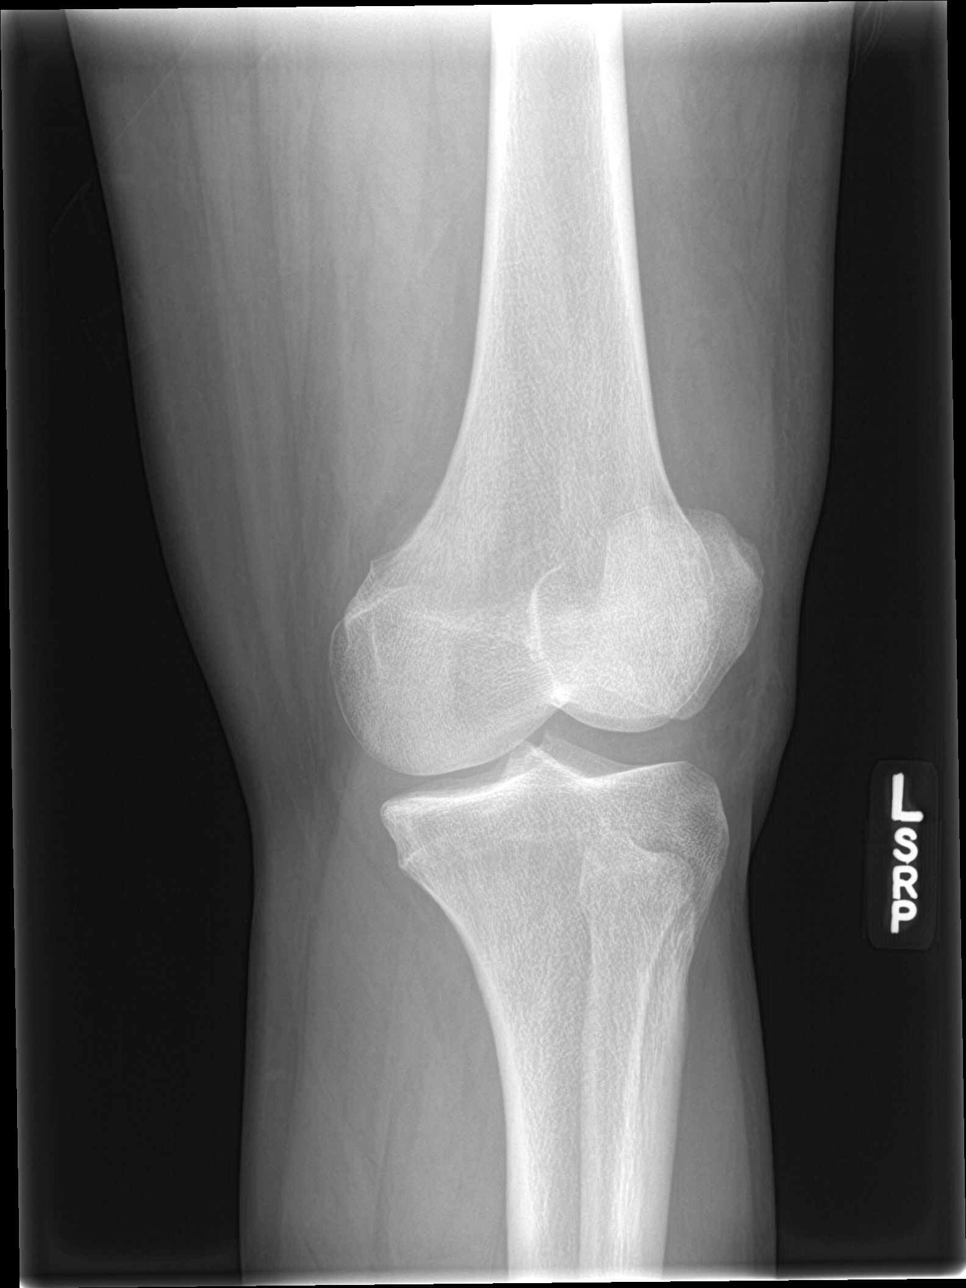

[knee lat]
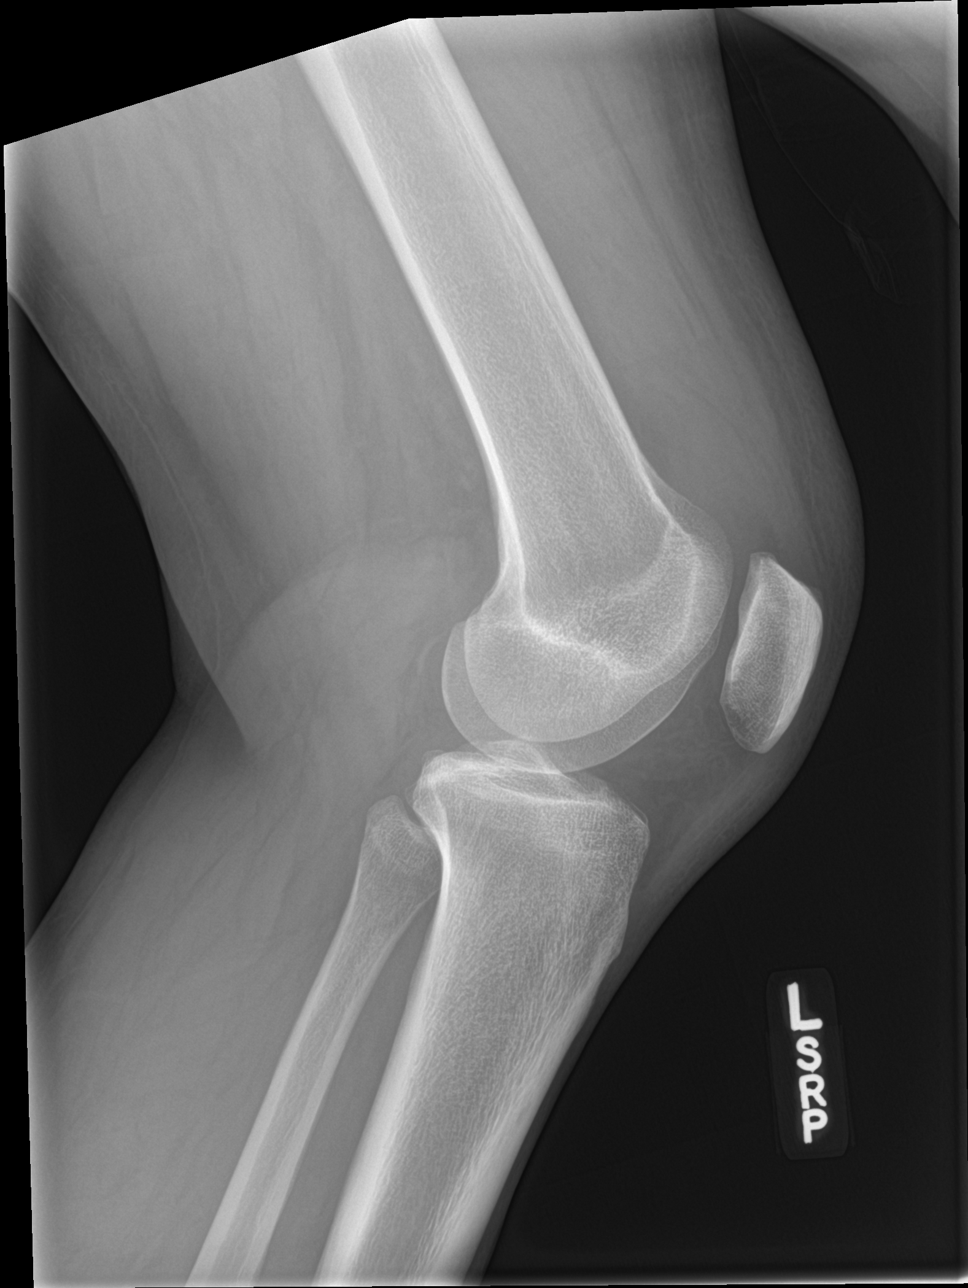

[4 of 4 positions shown; findings below may reference images not displayed]

FINDINGS: No fracture or bone lesion.

Knee joint normally spaced and aligned.  No arthropathic changes.

Small joint effusion.

Mild anterior subcutaneous soft tissue edema.
IMPRESSION: 1. No fracture, bone lesion or knee joint arthropathic changes.
2. Mild anterior subcutaneous soft tissue edema. Small knee joint
effusion.

## 2021-08-05 ENCOUNTER — Ambulatory Visit (HOSPITAL_COMMUNITY)
Admission: EM | Admit: 2021-08-05 | Discharge: 2021-08-05 | Disposition: A | Discharge status: Home / Self Care | Payer: BC Managed Care – PPO | Attending: Physician Assistant | Admitting: Physician Assistant

## 2021-08-05 ENCOUNTER — Encounter (HOSPITAL_COMMUNITY): Payer: Self-pay

## 2021-08-05 ENCOUNTER — Ambulatory Visit (INDEPENDENT_AMBULATORY_CARE_PROVIDER_SITE_OTHER): Payer: BC Managed Care – PPO

## 2021-08-05 DIAGNOSIS — M25551 Pain in right hip: Secondary | ICD-10-CM

## 2021-08-05 DIAGNOSIS — I10 Essential (primary) hypertension: Secondary | ICD-10-CM | POA: Diagnosis not present

## 2021-08-05 DIAGNOSIS — R0781 Pleurodynia: Secondary | ICD-10-CM | POA: Diagnosis not present

## 2021-08-05 DIAGNOSIS — W19XXXA Unspecified fall, initial encounter: Secondary | ICD-10-CM

## 2021-08-05 DIAGNOSIS — R079 Chest pain, unspecified: Secondary | ICD-10-CM

## 2021-08-05 MED ORDER — AMLODIPINE BESYLATE 10 MG PO TABS: 10.0000 mg | ORAL_TABLET | Freq: Every day | ORAL | 1 refills | Status: DC

## 2021-08-05 MED ORDER — TIZANIDINE HCL 4 MG PO TABS: 4.0000 mg | ORAL_TABLET | Freq: Three times a day (TID) | ORAL | 0 refills | Status: DC | PRN

## 2021-08-05 MED ORDER — HYDROCODONE-ACETAMINOPHEN 5-325 MG PO TABS: 1.0000 | ORAL_TABLET | Freq: Every evening | ORAL | 0 refills | Status: AC | PRN

## 2021-08-05 NOTE — Discharge Instructions (Signed)
Your blood pressure is very elevated today.  Please start amlodipine 10 mg.  Avoid, caffeine, decongestants, NSAIDs (aspirin, ibuprofen/Advil, naproxen/Aleve).  Monitor blood pressure at home and keep log for evaluation of follow-up appointment.  We will try to find your PCP.  You need to see either PCP or our clinic in 1 to 2 weeks for recheck.  If you develop any chest pain, shortness of breath, headache, vision changes in the setting of high blood pressure you need to go to the emergency room.  Your x-rays were normal.  I have called in muscle relaxer known as tizanidine to be taken 3 times a day.  This can make you sleepy so do not drive or drink alcohol while taking it.  Use Tylenol for pain relief.  Because of your elevated blood pressure you need to avoid NSAIDs including aspirin, ibuprofen/Advil, naproxen/Aleve.  I have called in 3 doses of hydrocodone that he can use at night.  This will make you very sleepy do not drive or drink alcohol with taking it.  Use heat, rest, stretch for additional symptom relief.  If your symptoms are not improving please follow-up with sports medicine as we discussed.  If you have any sudden worsening of pain or difficulty moving your head but you need to go to the emergency room.
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
## 2021-12-03 ENCOUNTER — Ambulatory Visit (HOSPITAL_COMMUNITY)
Admission: EM | Admit: 2021-12-03 | Discharge: 2021-12-03 | Disposition: A | Discharge status: Home / Self Care | Payer: BC Managed Care – PPO | Attending: Emergency Medicine | Admitting: Emergency Medicine

## 2021-12-03 ENCOUNTER — Encounter (HOSPITAL_COMMUNITY): Payer: Self-pay

## 2021-12-03 DIAGNOSIS — M5413 Radiculopathy, cervicothoracic region: Secondary | ICD-10-CM | POA: Diagnosis not present

## 2021-12-03 DIAGNOSIS — M62838 Other muscle spasm: Secondary | ICD-10-CM | POA: Diagnosis not present

## 2021-12-03 MED ORDER — BUPIVACAINE HCL (PF) 0.5 % IJ SOLN
INTRAMUSCULAR | Status: AC
  Filled 2021-12-03: qty 10

## 2021-12-03 MED ORDER — CYCLOBENZAPRINE HCL 10 MG PO TABS: 10.0000 mg | ORAL_TABLET | Freq: Two times a day (BID) | ORAL | 0 refills | Status: DC | PRN

## 2021-12-03 MED ORDER — NAPROXEN 375 MG PO TABS: 375.0000 mg | ORAL_TABLET | Freq: Two times a day (BID) | ORAL | 0 refills | Status: DC

## 2021-12-03 MED ORDER — TRIAMCINOLONE ACETONIDE 40 MG/ML IJ SUSP
40.0000 mg | Freq: Once | INTRAMUSCULAR | Status: AC
  Administered 2021-12-03: 40 mg via INTRAMUSCULAR

## 2021-12-03 MED ORDER — PREDNISONE 50 MG PO TABS: ORAL_TABLET | ORAL | 0 refills | Status: DC

## 2021-12-03 MED ORDER — TRIAMCINOLONE ACETONIDE 40 MG/ML IJ SUSP
INTRAMUSCULAR | Status: AC
  Filled 2021-12-03: qty 1

## 2021-12-03 MED ORDER — BUPIVACAINE HCL 0.5 % IJ SOLN
5.0000 mL | Freq: Once | INTRAMUSCULAR | Status: AC
  Administered 2021-12-03: 5 mL

## 2021-12-03 NOTE — ED Triage Notes (Signed)
2wk h/o left trapezius pain that radiates into her shoulder extending into the anterolateral aspect of her forearm before stopping at the elbow. Onset yesterday of n/t in left 2nd, 3rd and 4th digits. Notes pain and weakness with lifting arm. ?Has been using warm and cold compresses and using BC powder. No falls. Pt tearful during triage. ?

## 2021-12-03 NOTE — Discharge Instructions (Addendum)
Take muscle relaxer and steroid burst then started into Naprosyn daily.  Deep tissue massage may help.  You were given a trigger point injection today around the trapezius muscle where the majority of your spasm is.  Follow-up with physical therapy if not improving for reevaluation return if worse or new symptoms for reevaluation ?

## 2021-12-03 NOTE — ED Provider Notes (Signed)
?[facility] ? ? ?MRN: [id number] DOB: 1971-04-17 ? ?Subjective:  ? ?Chief Complaint;  ?Chief Complaint  ?Patient presents with  ? Shoulder Pain  ?  left  ? ?2wk h/o left trapezius pain that radiates into her shoulder extending into the anterolateral aspect of her forearm before stopping at the elbow. Onset yesterday of n/t in left 2nd, 3rd and 4th digits. Notes pain and weakness with lifting arm. ?Has been using warm and cold compresses and using BC powder. No falls. Pt tearful during triage. ?Amanda Casey is a 51 y.o. female presenting for left trapezius muscle spasm.  Patient works as a Training and development officer and has been over using the left shoulder.  The majority of the pain is in her left trapezius but she has tingling and numbness into the left fingers and deep crease strength overall of the left upper extremity symptoms have persisted for the last couple of weeks.  Patient has been taking Motrin 400 mg every 4-6 hours without relief ? ?No current facility-administered medications for this encounter. ? ?Current Outpatient Medications:  ?  cyclobenzaprine (FLEXERIL) 10 MG tablet, Take 1 tablet (10 mg total) by mouth 2 (two) times daily as needed for muscle spasms., Disp: 20 tablet, Rfl: 0 ?  naproxen (NAPROSYN) 375 MG tablet, Take 1 tablet (375 mg total) by mouth 2 (two) times daily., Disp: 20 tablet, Rfl: 0 ?  predniSONE (DELTASONE) 50 MG tablet, Take 1 pill daily for 5 days as directed, Disp: 5 tablet, Rfl: 0 ?  amLODipine (NORVASC) 10 MG tablet, Take 1 tablet (10 mg total) by mouth daily., Disp: 30 tablet, Rfl: 1 ?  ferrous sulfate (FERROUSUL) 325 (65 FE) MG tablet, Take 1 tablet (325 mg total) by mouth 2 (two) times daily., Disp: 60 tablet, Rfl: 1  ? ?No Known Allergies ? ?Past Medical History:  ?Diagnosis Date  ? Anemia   ? History of blood transfusion 01/08/2018  ? [place]  ? SVD (spontaneous vaginal delivery)   ? x 3  ?  ? ?Review of Systems  ?All other systems reviewed and are  negative. ? ? ?Objective:  ? ?Vitals: ?BP (!) 213/94 (BP Location: Right Arm)   Pulse 81   Temp 98.6 ?F (37 ?C) (Oral)   Resp 18   LMP  (LMP Unknown) Comment: cont. bleeding since Feb 2019  SpO2 99%  ? ?Physical Exam ?Vitals and nursing note reviewed.  ?Constitutional:   ?   General: She is not in acute distress. ?   Appearance: She is well-developed.  ?HENT:  ?   Head: Normocephalic and atraumatic.  ?Eyes:  ?   Conjunctiva/sclera: Conjunctivae normal.  ?Cardiovascular:  ?   Rate and Rhythm: Normal rate and regular rhythm.  ?   Heart sounds: No murmur heard. ?Pulmonary:  ?   Effort: Pulmonary effort is normal. No respiratory distress.  ?   Breath sounds: Normal breath sounds.  ?Abdominal:  ?   Palpations: Abdomen is soft.  ?   Tenderness: There is no abdominal tenderness.  ?Musculoskeletal:     ?   General: No swelling.  ?   Cervical back: Neck supple.  ?   Comments: Left trapezius muscle tenderness middle belly, limited range of motion due to pain left upper extremity.  Shoulder is nontender on exam no neurovascular deficit distal extremity no rhomboid muscle tenderness.  No rash or skin changes noted no midline spine tenderness of the cervical or thoracic spine  ?Skin: ?   General: Skin  is warm and dry.  ?   Capillary Refill: Capillary refill takes less than 2 seconds.  ?Neurological:  ?   Mental Status: She is alert.  ?Psychiatric:     ?   Mood and Affect: Mood normal.  ? ? ?A trigger point injection was performed at the site of maximal tenderness using 1% plain Lidocaine1cc and marcaine % 1cc and Kenalog '40mg'$  . This was well tolerated, and followed by some relief of pain.  ? ?No results found for this or any previous visit (from the past 24 hour(s)). ? ?No results found.  ?  ? ?Assessment and Plan :  ? ?1. Trapezius muscle spasm   ?2. Radiculopathy of cervicothoracic region   ? ? ?Meds ordered this encounter  ?Medications  ? triamcinolone acetonide (KENALOG-40) injection 40 mg  ? bupivacaine (MARCAINE) 0.5  % (with pres) injection 5 mL  ? predniSONE (DELTASONE) 50 MG tablet  ?  Sig: Take 1 pill daily for 5 days as directed  ?  Dispense:  5 tablet  ?  Refill:  0  ? cyclobenzaprine (FLEXERIL) 10 MG tablet  ?  Sig: Take 1 tablet (10 mg total) by mouth 2 (two) times daily as needed for muscle spasms.  ?  Dispense:  20 tablet  ?  Refill:  0  ? naproxen (NAPROSYN) 375 MG tablet  ?  Sig: Take 1 tablet (375 mg total) by mouth 2 (two) times daily.  ?  Dispense:  20 tablet  ?  Refill:  0  ? ? ?MDM:  ?Amanda Casey is a 51 y.o. female presenting for pain in the left trapezius muscle for the past 2 weeks.  She has been using anti-inflammatories and some topical agents like IcyHot without relief of her pain.  She has intermittent tingling and numbness down the left upper extremity on exam patient is very uncomfortable and had tenderness over the trapezius muscle no neurovascular deficit distally, no gross swelling, limited strength and range of motion due to pain.  Trigger point injection was given around the left trapezius belly.  She is sent home with a steroid burst for 5 days 50 mg and to continue using IcyHot or Biofreeze to the area she will then resume Naprosyn twice a day.  Patient may need physical therapy and ultrasound treatment if not improving to relax the muscle versus deep tissue massage.  I discussed treatment, follow up and return instructions. Questions were answered. Patient/representative stated understanding of instructions and patient is stable for discharge. ? ?Amanda Lauth FNP-C MCN  ?  ?Amanda Bump, NP ?12/03/21 2027 ? ?

## 2022-04-15 DIAGNOSIS — M25561 Pain in right knee: Secondary | ICD-10-CM | POA: Diagnosis not present

## 2022-12-03 ENCOUNTER — Telehealth: Payer: Self-pay

## 2022-12-03 NOTE — Telephone Encounter (Signed)
LVM for patient to call back. AS, CMA 

## 2022-12-17 ENCOUNTER — Ambulatory Visit
Admission: EM | Admit: 2022-12-17 | Discharge: 2022-12-17 | Disposition: A | Discharge status: Home / Self Care | Payer: BLUE CROSS/BLUE SHIELD | Attending: Family Medicine | Admitting: Family Medicine

## 2022-12-17 DIAGNOSIS — M549 Dorsalgia, unspecified: Secondary | ICD-10-CM | POA: Diagnosis not present

## 2022-12-17 DIAGNOSIS — I10 Essential (primary) hypertension: Secondary | ICD-10-CM | POA: Diagnosis not present

## 2022-12-17 DIAGNOSIS — S161XXA Strain of muscle, fascia and tendon at neck level, initial encounter: Secondary | ICD-10-CM | POA: Diagnosis not present

## 2022-12-17 MED ORDER — PREDNISONE 50 MG PO TABS: ORAL_TABLET | ORAL | 0 refills | Status: DC

## 2022-12-17 MED ORDER — KETOROLAC TROMETHAMINE 30 MG/ML IJ SOLN: 30.0000 mg | Freq: Once | INTRAMUSCULAR | Status: DC

## 2022-12-17 MED ORDER — CYCLOBENZAPRINE HCL 10 MG PO TABS: 10.0000 mg | ORAL_TABLET | Freq: Two times a day (BID) | ORAL | 0 refills | Status: DC | PRN

## 2022-12-17 MED ORDER — KETOROLAC TROMETHAMINE 30 MG/ML IJ SOLN
30.0000 mg | Freq: Once | INTRAMUSCULAR | Status: AC
  Administered 2022-12-17: 30 mg via INTRAMUSCULAR

## 2022-12-17 NOTE — Discharge Instructions (Signed)
Taking her blood pressure medication as your blood pressure here in clinic was elevated at 170/99 on repeat reading 170/80. For back and neck pain take prednisone as prescribed.  You may take Tylenol 1000 mg every 6 hours as needed with the prednisone.  Acute pain I have prescribed you cyclobenzaprine which is a muscle relaxer this can cause drowsiness therefore avoid taking while operating a motor vehicle.   Return if symptoms worsen or do not readily improve.

## 2022-12-17 NOTE — ED Triage Notes (Signed)
Pt states she lifted a heavy box and is having neck and upper and mid back pain for 3 days.  Has been using heating pad at home with no relief.

## 2022-12-17 NOTE — ED Provider Notes (Signed)
[facility]    CSN: [id number] Arrival date & time: 12/17/22  1313      History   Chief Complaint Chief Complaint  Patient presents with   Back Pain    HPI Amanda Casey is a 52 y.o. female.   HPI Presents today for acute neck and back pain.  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  She reports 3 days ago unloaded the truck and at some point during the day began to feel a slight pain in her neck mid back and persistent radiating pain from her neck to her back.  Last year she had a similar pattern of neck and back pain which responded to prednisone and a muscle relaxer.  Additionally patient's blood pressure is elevated.  On recheck blood pressure elevated at 170/80.  Reports a history of hypertension and was prescribed blood pressure medication which she has at home but had not been taking it regularly.  She denies any symptoms of headache, weakness, dizziness, or shortness of breath. Past Medical History:  Diagnosis Date   Anemia    History of blood transfusion 01/08/2018   [facility]   SVD (spontaneous vaginal delivery)    x 3    Patient Active Problem List   Diagnosis Date Noted   Post-operative state 01/18/2018   Anemia 01/07/2018   Symptomatic anemia 10/06/2017   Microcytic hypochromic anemia 10/06/2017    Past Surgical History:  Procedure Laterality Date   CYSTOSCOPY N/A 01/18/2018   Procedure: CYSTOSCOPY;  Surgeon: Allie Bossier, MD;  Location: [facility];  Service: Gynecology;  Laterality: N/A;   HYSTERECTOMY ABDOMINAL WITH SALPINGECTOMY Bilateral 01/18/2018   Procedure: HYSTERECTOMY ABDOMINAL WITH SALPINGECTOMY;  Surgeon: Allie Bossier, MD;  Location: [facility];  Service: Gynecology;  Laterality: Bilateral;   TUBAL LIGATION      Home Medications    Prior to Admission medications   Medication Sig Start Date End Date Taking? Authorizing Provider  amLODipine (NORVASC) 10 MG tablet Take 1  tablet (10 mg total) by mouth daily. 08/05/21   Raspet, Noberto Retort, PA-C  cyclobenzaprine (FLEXERIL) 10 MG tablet Take 1 tablet (10 mg total) by mouth 2 (two) times daily as needed for muscle spasms. 12/17/22   Bing Neighbors, NP  ferrous sulfate (FERROUSUL) 325 (65 FE) MG tablet Take 1 tablet (325 mg total) by mouth 2 (two) times daily. 01/08/18   Constant, Peggy, MD  naproxen (NAPROSYN) 375 MG tablet Take 1 tablet (375 mg total) by mouth 2 (two) times daily. 12/03/21   Jone Baseman, NP  predniSONE (DELTASONE) 50 MG tablet Take 1 pill daily for 5 days as directed 12/17/22   Bing Neighbors, NP    Family History Family History  Problem Relation Age of Onset   Diabetes Mellitus II Mother    Hypertension Mother     Social History Social History   Tobacco Use   Smoking status: Every Day    Packs/day: 0.50    Years: 6.00    Additional pack years: 0.00    Total pack years: 3.00    Types: Cigarettes   Smokeless tobacco: Never  Vaping Use   Vaping Use: Never used  Substance Use Topics   Alcohol use: Yes    Alcohol/week: 2.0 - 3.0 standard drinks of alcohol    Types: 1 Cans of beer, 1 - 2 Shots of liquor per week    Comment:  weekends   Drug use: No     Allergies   Patient has no known allergies.   Review of Systems Review of Systems Pertinent negatives listed in HPI  Physical Exam Triage Vital Signs ED Triage Vitals  Enc Vitals Group     BP 12/17/22 1344 (!) 170/99     Pulse Rate 12/17/22 1344 74     Resp 12/17/22 1344 16     Temp 12/17/22 1344 98.1 F (36.7 C)     Temp Source 12/17/22 1344 Oral     SpO2 12/17/22 1344 96 %     Weight --      Height --      Head Circumference --      Peak Flow --      Pain Score 12/17/22 1346 10     Pain Loc --      Pain Edu? --      Excl. in GC? --    No data found.  Updated Vital Signs BP (!) 170/80   Pulse 74   Temp 98.1 F (36.7 C) (Oral)   Resp 16   LMP  (LMP Unknown) Comment: cont. bleeding since Feb 2019  SpO2 96%    Visual Acuity Right Eye Distance:   Left Eye Distance:   Bilateral Distance:    Right Eye Near:   Left Eye Near:    Bilateral Near:     Physical Exam Vitals reviewed.  Constitutional:      Appearance: Normal appearance.  HENT:     Head: Normocephalic.  Cardiovascular:     Rate and Rhythm: Normal rate.  Pulmonary:     Effort: Pulmonary effort is normal.  Musculoskeletal:     Cervical back: Torticollis and tenderness present. No swelling. Pain with movement present. Decreased range of motion.     Thoracic back: Tenderness present. Decreased range of motion.  Neurological:     Mental Status: She is alert and oriented to person, place, and time.  Psychiatric:        Attention and Perception: Attention normal.        Speech: Speech normal.     UC Treatments / Results  Labs (all labs ordered are listed, but only abnormal results are displayed) Labs Reviewed - No data to display  EKG   Radiology No results found.  Procedures Procedures (including critical care time)  Medications Ordered in UC Medications  ketorolac (TORADOL) 30 MG/ML injection 30 mg (has no administration in time range)    Initial Impression / Assessment and Plan / UC Course  I have reviewed the triage vital signs and the nursing notes.  Pertinent labs & imaging results that were available during my care of the patient were reviewed by me and considered in my medical decision making (see chart for details).   Acute strain of the neck muscle and upper back, Toradol IM injection given here in clinic.  Outpatient management of symptoms with prednisone 50 mg once daily for 5 days and Flexeril 10 mg twice daily as needed for acute pain.  The with heat applications.  May also take Tylenol 1000 mg every 6 hours as needed. Work note provided.  Return if symptoms do not improve following treatment.  Patient verbalized understanding agreement with plan. Final Clinical Impressions(s) / UC Diagnoses   Final  diagnoses:  Acute strain of neck muscle, initial encounter  Upper back pain  Elevated blood pressure reading with diagnosis of hypertension     Discharge Instructions  Taking her blood pressure medication as your blood pressure here in clinic was elevated at 170/99 on repeat reading 170/80. For back and neck pain take prednisone as prescribed.  You may take Tylenol 1000 mg every 6 hours as needed with the prednisone.  Acute pain I have prescribed you cyclobenzaprine which is a muscle relaxer this can cause drowsiness therefore avoid taking while operating a motor vehicle.   Return if symptoms worsen or do not readily improve.     ED Prescriptions     Medication Sig Dispense Auth. Provider   cyclobenzaprine (FLEXERIL) 10 MG tablet Take 1 tablet (10 mg total) by mouth 2 (two) times daily as needed for muscle spasms. 20 tablet Bing Neighbors, NP   predniSONE (DELTASONE) 50 MG tablet Take 1 pill daily for 5 days as directed 5 tablet Bing Neighbors, NP      PDMP not reviewed this encounter.   Bing Neighbors, NP 12/17/22 1455

## 2023-05-19 ENCOUNTER — Ambulatory Visit: Payer: BLUE CROSS/BLUE SHIELD

## 2023-05-19 ENCOUNTER — Ambulatory Visit
Admission: EM | Admit: 2023-05-19 | Discharge: 2023-05-19 | Disposition: A | Discharge status: Home / Self Care | Payer: BLUE CROSS/BLUE SHIELD | Attending: Internal Medicine | Admitting: Internal Medicine

## 2023-05-19 DIAGNOSIS — M25561 Pain in right knee: Secondary | ICD-10-CM

## 2023-05-19 DIAGNOSIS — R519 Headache, unspecified: Secondary | ICD-10-CM | POA: Diagnosis not present

## 2023-05-19 DIAGNOSIS — M1711 Unilateral primary osteoarthritis, right knee: Secondary | ICD-10-CM | POA: Diagnosis not present

## 2023-05-19 DIAGNOSIS — M25461 Effusion, right knee: Secondary | ICD-10-CM | POA: Diagnosis not present

## 2023-05-19 DIAGNOSIS — M542 Cervicalgia: Secondary | ICD-10-CM | POA: Diagnosis not present

## 2023-05-19 DIAGNOSIS — I10 Essential (primary) hypertension: Secondary | ICD-10-CM | POA: Diagnosis not present

## 2023-05-19 MED ORDER — KETOROLAC TROMETHAMINE 30 MG/ML IJ SOLN
30.0000 mg | Freq: Once | INTRAMUSCULAR | Status: AC
  Administered 2023-05-19: 30 mg via INTRAMUSCULAR

## 2023-05-19 MED ORDER — METHOCARBAMOL 500 MG PO TABS: 500.0000 mg | ORAL_TABLET | Freq: Two times a day (BID) | ORAL | 0 refills | Status: AC | PRN

## 2023-05-19 MED ORDER — AMLODIPINE BESYLATE 10 MG PO TABS: 10.0000 mg | ORAL_TABLET | Freq: Every day | ORAL | 0 refills | Status: DC

## 2023-05-19 NOTE — ED Triage Notes (Signed)
Patient here today with c/o headache and neck pain X 4 days upon waking. Has tried IBU and Tylenol with no relief. She ran out of her blood pressure medication 2-3 weeks ago.   She also c/o right knee pain and swelling X 2-3 weeks. Nothing helps. No known injury. H/o right knee surgery 20+ years ago. She is on her feet a lot at work.

## 2023-05-19 NOTE — ED Provider Notes (Incomplete)
[facility]    CSN: [id number] Arrival date & time: 05/19/23  1736      History   Chief Complaint Chief Complaint  Patient presents with   Headache   Knee Pain    HPI Amanda Casey is a 52 y.o. female.   Patient presents with several different chief complaints today.  Patient reports that she has had a 4-day history of headache and neck pain.  Neck pain is present on the left lateral side and headache is present in the frontal portion of her head.  She rates pain 10/10 on pain scale.  Denies any falls, head injuries, injuries to the neck.  She reports she has been intermittently dizzy.  Denies history of migraines.  She has taken Tylenol and ibuprofen for pain with last dose of medication being Tylenol today.  She states that she has been out of her blood pressure medication for a month.  It was originally prescribed by urgent care she has not had follow-up with PCP for it.  She does not monitor her blood pressure at home.  Patient also reporting some right knee pain that has been present for about 2 to 3 weeks.  Reports that she had a previous history of surgery in that knee approximately 20 years ago.  She is not sure exactly what surgery was but denies that it was a knee replacement.  Denies any recent injury to the knee.  She does a lot of prolonged standing at work.   Headache Knee Pain   Past Medical History:  Diagnosis Date   Anemia    History of blood transfusion 01/08/2018   [facility]   SVD (spontaneous vaginal delivery)    x 3    Patient Active Problem List   Diagnosis Date Noted   Post-operative state 01/18/2018   Anemia 01/07/2018   Symptomatic anemia 10/06/2017   Microcytic hypochromic anemia 10/06/2017    Past Surgical History:  Procedure Laterality Date   CYSTOSCOPY N/A 01/18/2018   Procedure: CYSTOSCOPY;  Surgeon: Allie Bossier, MD;  Location: [facility];  Service: Gynecology;  Laterality: N/A;   HYSTERECTOMY ABDOMINAL WITH SALPINGECTOMY Bilateral  01/18/2018   Procedure: HYSTERECTOMY ABDOMINAL WITH SALPINGECTOMY;  Surgeon: Allie Bossier, MD;  Location: [facility];  Service: Gynecology;  Laterality: Bilateral;   TUBAL LIGATION      OB History   No obstetric history on file.      Home Medications    Prior to Admission medications   Medication Sig Start Date End Date Taking? Authorizing Provider  methocarbamol (ROBAXIN) 500 MG tablet Take 1 tablet (500 mg total) by mouth 2 (two) times daily as needed for muscle spasms. 05/19/23  Yes Mohamud Mrozek, Rolly Salter E, FNP  amLODipine (NORVASC) 10 MG tablet Take 1 tablet (10 mg total) by mouth daily. 05/19/23   Gustavus Bryant, FNP  ferrous sulfate (FERROUSUL) 325 (65 FE) MG tablet Take 1 tablet (325 mg total) by mouth 2 (two) times daily. 01/08/18   Constant, Peggy, MD  naproxen (NAPROSYN) 375 MG tablet Take 1 tablet (375 mg total) by mouth 2 (two) times daily. 12/03/21   Jone Baseman, NP  predniSONE (DELTASONE) 50 MG tablet Take 1 pill daily for 5 days as directed 12/17/22   Bing Neighbors, NP    Family History Family History  Problem Relation Age of Onset   Diabetes Mellitus II Mother    Hypertension Mother     Social History Social History   Tobacco Use  Smoking status: Every Day    Current packs/day: 0.50    Average packs/day: 0.5 packs/day for 6.0 years (3.0 ttl pk-yrs)    Types: Cigarettes   Smokeless tobacco: Never  Vaping Use   Vaping status: Never Used  Substance Use Topics   Alcohol use: Yes    Alcohol/week: 2.0 - 3.0 standard drinks of alcohol    Types: 1 Cans of beer, 1 - 2 Shots of liquor per week    Comment: weekends   Drug use: No     Allergies   Patient has no known allergies.   Review of Systems Review of Systems Per HPI  Physical Exam Triage Vital Signs ED Triage Vitals [05/19/23 1801]  Encounter Vitals Group     BP (!) 193/77     Systolic BP Percentile      Diastolic BP Percentile      Pulse Rate 71     Resp 16     Temp 98.2 F (36.8 C)     Temp Source  Oral     SpO2 97 %     Weight      Height 5\' 2"  (1.575 m)     Head Circumference      Peak Flow      Pain Score 10     Pain Loc      Pain Education      Exclude from Growth Chart    No data found.  Updated Vital Signs BP (!) 193/77 (BP Location: Left Arm)   Pulse 71   Temp 98.2 F (36.8 C) (Oral)   Resp 16   Ht 5\' 2"  (1.575 m)   LMP  (LMP Unknown) Comment: cont. bleeding since Feb 2019  SpO2 97%   BMI 27.56 kg/m   Visual Acuity Right Eye Distance:   Left Eye Distance:   Bilateral Distance:    Right Eye Near:   Left Eye Near:    Bilateral Near:     Physical Exam Constitutional:      General: She is not in acute distress.    Appearance: Normal appearance. She is not toxic-appearing or diaphoretic.  HENT:     Head: Normocephalic and atraumatic.  Eyes:     Extraocular Movements: Extraocular movements intact.     Conjunctiva/sclera: Conjunctivae normal.     Pupils: Pupils are equal, round, and reactive to light.  Cardiovascular:     Rate and Rhythm: Normal rate and regular rhythm.     Pulses: Normal pulses.     Heart sounds: Normal heart sounds.  Pulmonary:     Effort: Pulmonary effort is normal. No respiratory distress.     Breath sounds: Normal breath sounds.  Musculoskeletal:     Comments: Patient has tenderness to palpation to lower anterior right knee.  No swelling or discoloration noted.  Patient has full range of motion of knee.  Capillary refill and pulses are intact.  Neurological:     General: No focal deficit present.     Mental Status: She is alert and oriented to person, place, and time. Mental status is at baseline.     Cranial Nerves: Cranial nerves 2-12 are intact.     Sensory: Sensation is intact.     Motor: Motor function is intact.     Coordination: Coordination is intact.     Gait: Gait is intact.  Psychiatric:        Mood and Affect: Mood normal.        Behavior: Behavior normal.  Thought Content: Thought content normal.         Judgment: Judgment normal.      UC Treatments / Results  Labs (all labs ordered are listed, but only abnormal results are displayed) Labs Reviewed  BASIC METABOLIC PANEL    EKG   Radiology No results found.  Procedures Procedures (including critical care time)  Medications Ordered in UC Medications  ketorolac (TORADOL) 30 MG/ML injection 30 mg (30 mg Intramuscular Given 05/19/23 1937)    Initial Impression / Assessment and Plan / UC Course  I have reviewed the triage vital signs and the nursing notes.  Pertinent labs & imaging results that were available during my care of the patient were reviewed by me and considered in my medical decision making (see chart for details).     *** Final Clinical Impressions(s) / UC Diagnoses   Final diagnoses:  Hypertension, unspecified type  Acute nonintractable headache, unspecified headache type  Acute pain of right knee  Neck pain     Discharge Instructions      I will call if x-ray results are abnormal.  I have refilled your blood pressure medication.  Start taking immediately and monitor blood pressure closely.  If blood pressure persists or worsens or if headache worsens or persists, please go straight to the emergency department.  I have prescribed you a muscle relaxer to take as needed for your neck pain as well.  Please be advised that it can make you sleepy so no driving or drinking alcohol with it.  You were also given a shot today in urgent care for pain.  Do not take any ibuprofen, Advil, Aleve for least 24 hours following injection.  Follow-up with orthopedist for any pain.   ED Prescriptions     Medication Sig Dispense Auth. Provider   amLODipine (NORVASC) 10 MG tablet Take 1 tablet (10 mg total) by mouth daily. 90 tablet [place], [place], [place]   methocarbamol (ROBAXIN) 500 MG tablet Take 1 tablet (500 mg total) by mouth 2 (two) times daily as needed for muscle spasms. 20 tablet [place], [place]ie [place], [place]      PDMP not  reviewed this encounter.

## 2023-05-19 NOTE — Discharge Instructions (Signed)
I will call if x-ray results are abnormal.  I have refilled your blood pressure medication.  Start taking immediately and monitor blood pressure closely.  If blood pressure persists or worsens or if headache worsens or persists, please go straight to the emergency department.  I have prescribed you a muscle relaxer to take as needed for your neck pain as well.  Please be advised that it can make you sleepy so no driving or drinking alcohol with it.  You were also given a shot today in urgent care for pain.  Do not take any ibuprofen, Advil, Aleve for least 24 hours following injection.  Follow-up with orthopedist for any pain.

## 2023-05-20 ENCOUNTER — Telehealth: Payer: Self-pay | Admitting: Internal Medicine

## 2023-05-20 MED ORDER — PREDNISONE 20 MG PO TABS: 40.0000 mg | ORAL_TABLET | Freq: Every day | ORAL | 0 refills | Status: AC

## 2023-05-20 NOTE — Telephone Encounter (Signed)
Called patient to discuss x-ray results.  Discussed with patient prednisone steroid therapy to see if this will be helpful with knee pain and neck pain.  Patient wishes to proceed with prednisone therapy.  She has taken prednisone previously and tolerated well with no significant contraindications to steroid therapy.  Also discussed with patient wearing a knee brace.  She states that she is going to go to the store today to get a knee brace.  I did inquire about blood pressure as well given blood pressure was significantly elevated at urgent care visit.  Patient states that the pharmacy was closed when she went to get her medicine last night, so she has not started taking it.  Advised patient to get her medication as soon as possible and start taking it as well as monitoring her blood pressure diligently.  Patient verbalized understanding and was agreeable with plan.  All questions answered.

## 2023-05-21 LAB — BASIC METABOLIC PANEL
BUN/Creatinine Ratio: 31 — ABNORMAL HIGH (ref 9–23)
BUN: 18 mg/dL (ref 6–24)
CO2: 21 mmol/L (ref 20–29)
Calcium: 10 mg/dL (ref 8.7–10.2)
Chloride: 104 mmol/L (ref 96–106)
Creatinine, Ser: 0.59 mg/dL (ref 0.57–1.00)
Glucose: 89 mg/dL (ref 70–99)
Potassium: 3.5 mmol/L (ref 3.5–5.2)
Sodium: 144 mmol/L (ref 134–144)
eGFR: 108 mL/min/{1.73_m2} (ref >59)

## 2023-06-04 ENCOUNTER — Ambulatory Visit (INDEPENDENT_AMBULATORY_CARE_PROVIDER_SITE_OTHER): Payer: BC Managed Care – PPO | Admitting: Family

## 2023-06-04 ENCOUNTER — Encounter: Payer: Self-pay | Admitting: Family

## 2023-06-04 VITALS — BP 140/94 | HR 86 | Temp 98.6°F | Resp 20 | Ht 62.0 in | Wt 161.4 lb

## 2023-06-04 DIAGNOSIS — I10 Essential (primary) hypertension: Secondary | ICD-10-CM | POA: Diagnosis not present

## 2023-06-04 DIAGNOSIS — Z1159 Encounter for screening for other viral diseases: Secondary | ICD-10-CM | POA: Diagnosis not present

## 2023-06-04 DIAGNOSIS — Z1211 Encounter for screening for malignant neoplasm of colon: Secondary | ICD-10-CM | POA: Diagnosis not present

## 2023-06-04 DIAGNOSIS — M25561 Pain in right knee: Secondary | ICD-10-CM

## 2023-06-04 DIAGNOSIS — Z1231 Encounter for screening mammogram for malignant neoplasm of breast: Secondary | ICD-10-CM | POA: Diagnosis not present

## 2023-06-04 DIAGNOSIS — G8929 Other chronic pain: Secondary | ICD-10-CM | POA: Diagnosis not present

## 2023-06-04 DIAGNOSIS — F17209 Nicotine dependence, unspecified, with unspecified nicotine-induced disorders: Secondary | ICD-10-CM | POA: Diagnosis not present

## 2023-06-04 DIAGNOSIS — E785 Hyperlipidemia, unspecified: Secondary | ICD-10-CM | POA: Diagnosis not present

## 2023-06-04 DIAGNOSIS — Z789 Other specified health status: Secondary | ICD-10-CM

## 2023-06-04 DIAGNOSIS — F1290 Cannabis use, unspecified, uncomplicated: Secondary | ICD-10-CM

## 2023-06-04 DIAGNOSIS — Z13220 Encounter for screening for lipoid disorders: Secondary | ICD-10-CM | POA: Diagnosis not present

## 2023-06-04 NOTE — Patient Instructions (Addendum)
Book Resources:   1.[facility] Help for Emotions: Managing Anxiety, Anger, and Depression 2. Depression Way by Dr. Brien Few   3. Telling yourself the Truth

## 2023-06-04 NOTE — Progress Notes (Signed)
Provider: Richarda Blade FNP-C   Etta Gassett, Donalee Citrin, NP  Patient Care Team: Meris Reede, Donalee Citrin, NP as PCP - General (Family Medicine)  Extended Emergency Contact Information Primary Emergency Contact: Darcella Ches[place] Home Phone: [phone] Mobile Phone: [phone] Relation: Daughter  Code Status:  Full Code  Goals of care: Advanced Directive information    06/04/2023    9:48 AM  Advanced Directives  Does Patient Have a Medical Advance Directive? No  Would patient like information on creating a medical advance directive? No - Patient declined     Chief Complaint  Patient presents with   New Patient (Initial Visit)    Patient presents today for new patient appointment    HPI:  Pt is a 52 y.o. female seen today establish care here at [facility] for medical management of chronic diseases.  Hypertension - did not take her blood medication this morning states run out of water.brought in her medication.she denies any headache,dizziness,vision changes,fatigue,chest tightness,palpitation,chest pain or shortness of breath.     Smokes 7-8 cigarettes per day has smoked 6 yrs. Also smokes marijuana every once in a while.   Drinks alcohol Bud light 3 cans during the weekend.    Past Medical History:  Diagnosis Date   Anemia    History of blood transfusion 01/08/2018   [facility]   SVD (spontaneous vaginal delivery)    x 3   Past Surgical History:  Procedure Laterality Date   CYSTOSCOPY N/A 01/18/2018   Procedure: CYSTOSCOPY;  Surgeon: Allie Bossier, MD;  Location: [facility];  Service: Gynecology;  Laterality: N/A;   HYSTERECTOMY ABDOMINAL WITH SALPINGECTOMY Bilateral 01/18/2018   Procedure: HYSTERECTOMY ABDOMINAL WITH SALPINGECTOMY;  Surgeon: Allie Bossier, MD;  Location: [facility];  Service: Gynecology;  Laterality: Bilateral;   TUBAL LIGATION      No Known Allergies  Allergies as of 06/04/2023   No Known Allergies      Medication List         Accurate as of June 04, 2023 10:08 AM. If you have any questions, ask your nurse or doctor.          amLODipine 10 MG tablet Commonly known as: NORVASC Take 1 tablet (10 mg total) by mouth daily.   ferrous sulfate 325 (65 FE) MG tablet Commonly known as: FerrouSul Take 1 tablet (325 mg total) by mouth 2 (two) times daily.   methocarbamol 500 MG tablet Commonly known as: ROBAXIN Take 1 tablet (500 mg total) by mouth 2 (two) times daily as needed for muscle spasms.   naproxen 375 MG tablet Commonly known as: NAPROSYN Take 1 tablet (375 mg total) by mouth 2 (two) times daily.        Review of Systems  Constitutional:  Negative for appetite change, chills, fatigue, fever and unexpected weight change.  HENT:  Negative for congestion, dental problem, ear discharge, ear pain, facial swelling, hearing loss, nosebleeds, postnasal drip, rhinorrhea, sinus pressure, sinus pain, sneezing, sore throat, tinnitus and trouble swallowing.   Eyes:  Negative for pain, discharge, redness, itching and visual disturbance.  Respiratory:  Negative for cough, chest tightness, shortness of breath and wheezing.   Cardiovascular:  Negative for chest pain, palpitations and leg swelling.  Gastrointestinal:  Negative for abdominal distention, abdominal pain, blood in stool, constipation, diarrhea, nausea and vomiting.  Endocrine: Negative for cold intolerance, heat intolerance, polydipsia, polyphagia and polyuria.  Genitourinary:  Negative for difficulty urinating, dysuria, flank pain, frequency and  urgency.  Musculoskeletal:  Negative for arthralgias, back pain, gait problem, joint swelling, myalgias, neck pain and neck stiffness.  Skin:  Negative for color change, pallor, rash and wound.  Neurological:  Negative for dizziness, syncope, speech difficulty, weakness, light-headedness, numbness and headaches.  Hematological:  Does not bruise/bleed easily.  Psychiatric/Behavioral:  Negative for  agitation, behavioral problems, confusion, hallucinations, self-injury, sleep disturbance and suicidal ideas. The patient is not nervous/anxious.      There is no immunization history on file for this patient. Pertinent  Health Maintenance Due  Topic Date Due   Colonoscopy  Never done   MAMMOGRAM  Never done   INFLUENZA VACCINE  11/08/2023 (Originally 03/11/2023)      11/01/2019    2:44 PM 08/05/2021    8:01 PM 12/03/2021    7:40 PM 06/04/2023    9:48 AM  Fall Risk  Falls in the past year?    0  Was there an injury with Fall?    0  Fall Risk Category Calculator    0  (RETIRED) Patient Fall Risk Level Low fall risk Low fall risk Low fall risk   Patient at Risk for Falls Due to    No Fall Risks  Fall risk Follow up    Falls evaluation completed   Functional Status Survey:    Vitals:   06/04/23 0935 06/04/23 0951  BP: (!) 142/96 (!) 140/94  Pulse: 86   Resp: 20   Temp: 98.6 F (37 C)   SpO2: 99%   Weight: 161 lb 6.4 oz (73.2 kg)   Height: 5\' 2"  (1.575 m)    Body mass index is 29.52 kg/m. Physical Exam Vitals reviewed.  Constitutional:      General: She is not in acute distress.    Appearance: Normal appearance. She is overweight. She is not ill-appearing or diaphoretic.  HENT:     Head: Normocephalic.     Right Ear: Tympanic membrane, ear canal and external ear normal. There is no impacted cerumen.     Left Ear: Tympanic membrane, ear canal and external ear normal. There is no impacted cerumen.     Nose: Nose normal. No congestion or rhinorrhea.     Mouth/Throat:     Mouth: Mucous membranes are moist.     Pharynx: Oropharynx is clear. No oropharyngeal exudate or posterior oropharyngeal erythema.  Eyes:     General: No scleral icterus.       Right eye: No discharge.        Left eye: No discharge.     Extraocular Movements: Extraocular movements intact.     Conjunctiva/sclera: Conjunctivae normal.     Pupils: Pupils are equal, round, and reactive to light.  Neck:      Vascular: No carotid bruit.  Cardiovascular:     Rate and Rhythm: Normal rate and regular rhythm.     Pulses: Normal pulses.     Heart sounds: Normal heart sounds. No murmur heard.    No friction rub. No gallop.  Pulmonary:     Effort: Pulmonary effort is normal. No respiratory distress.     Breath sounds: Normal breath sounds. No wheezing, rhonchi or rales.  Chest:     Chest wall: No tenderness.  Abdominal:     General: Bowel sounds are normal. There is no distension.     Palpations: Abdomen is soft. There is no mass.     Tenderness: There is no abdominal tenderness. There is no right CVA tenderness, left CVA tenderness, guarding  or rebound.  Musculoskeletal:        General: No swelling or tenderness. Normal range of motion.     Cervical back: Normal range of motion. No rigidity or tenderness.     Right lower leg: No edema.     Left lower leg: No edema.  Lymphadenopathy:     Cervical: No cervical adenopathy.  Skin:    General: Skin is warm and dry.     Coloration: Skin is not pale.     Findings: No bruising, erythema, lesion or rash.  Neurological:     Mental Status: She is alert and oriented to person, place, and time.     Cranial Nerves: No cranial nerve deficit.     Sensory: No sensory deficit.     Motor: No weakness.     Coordination: Coordination normal.     Gait: Gait normal.  Psychiatric:        Mood and Affect: Mood normal.        Speech: Speech normal.        Behavior: Behavior normal.        Thought Content: Thought content normal.        Judgment: Judgment normal.     Labs reviewed: Recent Labs    05/19/23 1937  NA 144  K 3.5  CL 104  CO2 21  GLUCOSE 89  BUN 18  CREATININE 0.59  CALCIUM 10.0   No results for input(s): "AST", "ALT", "ALKPHOS", "BILITOT", "PROT", "ALBUMIN" in the last 8760 hours. No results for input(s): "WBC", "NEUTROABS", "HGB", "HCT", "MCV", "PLT" in the last 8760 hours. Lab Results  Component Value Date   TSH 0.880  01/07/2018   No results found for: "HGBA1C" No results found for: "CHOL", "HDL", "LDLCALC", "LDLDIRECT", "TRIG", "CHOLHDL"  Significant Diagnostic Results in last 30 days:  DG Knee Complete 4 Views Right  Result Date: 05/19/2023 CLINICAL DATA:  Knee pain and swelling for 2-3 weeks. EXAM: RIGHT KNEE - COMPLETE 4+ VIEW COMPARISON:  11/01/2019 FINDINGS: No acute fracture or dislocation. Prior ACL repair. Mild peripheral spurring with preservation of joint spaces. Minimal knee joint effusion. No erosive change or focal bone abnormality. IMPRESSION: 1. Mild osteoarthritis with minimal knee joint effusion. 2. Prior ACL repair. Electronically Signed   By: Narda Rutherford M.D.   On: 05/19/2023 21:12    Assessment/Plan  1. Essential hypertension B/p slightly elevated suspect due to knee pain - Advised to check Blood pressure at home and record on log provided and notify provider if B/p > 140/90  -Dietary modification and exercise advised - continue on amlodipine - TSH - COMPLETE METABOLIC PANEL WITH GFR - CBC with Differential/Platelet  2. Chronic pain of right knee Has worsened -Continue on  meloxicam and Robaxin - Ambulatory referral to Orthopedic Surgery  3. Screening for hyperlipidemia Dietary modification and exercise advised - Lipid panel  4. Encounter for hepatitis C screening test for low risk patient Low risk - Hep C Antibody  5. Breast cancer screening by mammogram Asymptomatic -Monthly breast exam advised - MM 3D SCREENING MAMMOGRAM BILATERAL BREAST  6. Colon cancer screening Cologuard versus colonoscopy discussed patient prefers colonoscopy.  Will refer to gastroenterology.Made aware specialist office will call him to schedule appointment. - Ambulatory referral to Gastroenterology  7. Alcohol use Drinks alcohol Bud light 3 cans during the weekend. Alcohol use cessation advised  8. Tobacco use disorder, continuous Smokes 7-8 cigarettes per day has smoked 6  yrs. Smoking cessation advised. Nicotine patch and Wellbutrin also discussed  but not ready to quit  9. Marijuana smoker, episodic smokes marijuana every once in a while.  Marijuana use cessation advised  Family/ staff Communication: Reviewed plan of care with patient verbalized understanding  Labs/tests ordered:  - TSH - COMPLETE METABOLIC PANEL WITH GFR - CBC with Differential/Platelet - Lipid panel - Hep C Antibody - MM 3D SCREENING MAMMOGRAM BILATERAL BREAST  Next Appointment : Return in about 6 months (around 12/03/2023) for medical mangement of chronic issues.Pap smear in one week.   Caesar Bookman, NP

## 2023-06-05 LAB — LIPID PANEL
Cholesterol: 211 mg/dL — ABNORMAL HIGH (ref <200)
HDL: 70 mg/dL (ref >=50)
LDL Cholesterol (Calc): 122 mg/dL — ABNORMAL HIGH
Non-HDL Cholesterol (Calc): 141 mg/dL — ABNORMAL HIGH (ref <130)
Total CHOL/HDL Ratio: 3 (calc) (ref <5.0)
Triglycerides: 89 mg/dL (ref <150)

## 2023-06-05 LAB — COMPLETE METABOLIC PANEL WITH GFR
AG Ratio: 1.8 (calc) (ref 1.0–2.5)
ALT: 33 U/L — ABNORMAL HIGH (ref 6–29)
AST: 21 U/L (ref 10–35)
Albumin: 4.8 g/dL (ref 3.6–5.1)
Alkaline phosphatase (APISO): 114 U/L (ref 37–153)
BUN/Creatinine Ratio: 40 (calc) — ABNORMAL HIGH (ref 6–22)
BUN: 19 mg/dL (ref 7–25)
CO2: 29 mmol/L (ref 20–32)
Calcium: 10.1 mg/dL (ref 8.6–10.4)
Chloride: 105 mmol/L (ref 98–110)
Creat: 0.48 mg/dL — ABNORMAL LOW (ref 0.50–1.03)
Globulin: 2.7 g/dL (ref 1.9–3.7)
Glucose, Bld: 112 mg/dL — ABNORMAL HIGH (ref 65–99)
Potassium: 4.3 mmol/L (ref 3.5–5.3)
Sodium: 142 mmol/L (ref 135–146)
Total Bilirubin: 0.5 mg/dL (ref 0.2–1.2)
Total Protein: 7.5 g/dL (ref 6.1–8.1)
eGFR: 113 mL/min/{1.73_m2} (ref >=60)

## 2023-06-05 LAB — CBC WITH DIFFERENTIAL/PLATELET
Absolute Lymphocytes: 2975 {cells}/uL (ref 850–3900)
Absolute Monocytes: 540 {cells}/uL (ref 200–950)
Basophils Absolute: 28 {cells}/uL (ref 0–200)
Basophils Relative: 0.4 %
Eosinophils Absolute: 99 {cells}/uL (ref 15–500)
Eosinophils Relative: 1.4 %
HCT: 40.5 % (ref 35.0–45.0)
Hemoglobin: 13.1 g/dL (ref 11.7–15.5)
MCH: 28.2 pg (ref 27.0–33.0)
MCHC: 32.3 g/dL (ref 32.0–36.0)
MCV: 87.3 fL (ref 80.0–100.0)
MPV: 10.3 fL (ref 7.5–12.5)
Monocytes Relative: 7.6 %
Neutro Abs: 3458 {cells}/uL (ref 1500–7800)
Neutrophils Relative %: 48.7 %
Platelets: 401 10*3/uL — ABNORMAL HIGH (ref 140–400)
RBC: 4.64 10*6/uL (ref 3.80–5.10)
RDW: 14.8 % (ref 11.0–15.0)
Total Lymphocyte: 41.9 %
WBC: 7.1 10*3/uL (ref 3.8–10.8)

## 2023-06-05 LAB — TSH: TSH: 0.87 m[IU]/L

## 2023-06-05 LAB — HEPATITIS C ANTIBODY: Hepatitis C Ab: NONREACTIVE

## 2023-06-09 ENCOUNTER — Encounter: Payer: Self-pay | Admitting: Physician Assistant

## 2023-06-09 ENCOUNTER — Ambulatory Visit (INDEPENDENT_AMBULATORY_CARE_PROVIDER_SITE_OTHER): Payer: BLUE CROSS/BLUE SHIELD | Admitting: Physician Assistant

## 2023-06-09 DIAGNOSIS — M25561 Pain in right knee: Secondary | ICD-10-CM | POA: Insufficient documentation

## 2023-06-09 MED ORDER — LIDOCAINE HCL 1 % IJ SOLN
2.0000 mL | INTRAMUSCULAR | Status: AC | PRN
  Administered 2023-06-09: 2 mL

## 2023-06-09 MED ORDER — BUPIVACAINE HCL 0.25 % IJ SOLN
2.0000 mL | INTRAMUSCULAR | Status: AC | PRN
  Administered 2023-06-09: 2 mL via INTRA_ARTICULAR

## 2023-06-09 MED ORDER — METHYLPREDNISOLONE ACETATE 40 MG/ML IJ SUSP
80.0000 mg | INTRAMUSCULAR | Status: AC | PRN
  Administered 2023-06-09: 80 mg via INTRA_ARTICULAR

## 2023-06-09 MED ORDER — MELOXICAM 7.5 MG PO TABS: 7.5000 mg | ORAL_TABLET | Freq: Every day | ORAL | 0 refills | Status: DC

## 2023-06-09 NOTE — Progress Notes (Signed)
Office Visit Note   Patient: Amanda Casey           Date of Birth: 1971/04/11           MRN: [id number] Visit Date: 06/09/2023              Requested by: Caesar Bookman, NP [address] [place],  [place] [address] PCP: Caesar Bookman, NP   Assessment & Plan: Visit Diagnoses:  1. Right knee pain, unspecified chronicity     Plan: Patient is a pleasant 52 year old woman who presents with a chronic right knee pain.  She is status post ACL reconstruction over 20 years ago.  She is done overall well but is beginning to develop more pain.  She denies any new injury.  She denies any gross instability.  Denies any fever or chills.  Her x-rays do not demonstrate any compromise of her hardware.  She does have quite a bit of crepitus with knee range of motion beneath her kneecap and has some scalloping over the distal quadriceps.  She admits she has trouble with stairs rates her pain is moderate.  Would like for her to engage with physical therapy she is willing to do this.  Also gave her information about Voltaren gel and if prescribed her some meloxicam.  She knows not to take other anti-inflammatories with this.  Will follow-up in about 6 weeks she also like to try a steroid injection today  Follow-Up Instructions: No follow-ups on file.   Orders:  No orders of the defined types were placed in this encounter.  No orders of the defined types were placed in this encounter.     Procedures: Large Joint Inj on 06/09/2023 11:24 AM Indications: pain and diagnostic evaluation Details: 25 G 1.5 in needle, anteromedial approach  Arthrogram: No  Medications: 80 mg methylPREDNISolone acetate 40 MG/ML; 2 mL lidocaine 1 %; 2 mL bupivacaine 0.25 % Outcome: tolerated well, no immediate complications Procedure, treatment alternatives, risks and benefits explained, specific risks discussed. Consent was given by the patient.     Clinical Data: No additional findings.   Subjective: Chief  Complaint  Patient presents with  . Right Knee - Pain    HPI Patient is a pleasant 52 year old woman with a complaint of right knee pain no recent injury but has a history of an ACL reconstruction 20 years ago.  Pain is mostly medial and beneath her kneecap increased with ascending or descending stairs Review of Systems  All other systems reviewed and are negative.    Objective: Vital Signs: LMP  (LMP Unknown) Comment: cont. bleeding since Feb 2019  Physical Exam Constitutional:      Appearance: Normal appearance.  Pulmonary:     Effort: Pulmonary effort is normal.  Skin:    General: Skin is warm and dry.  Neurological:     General: No focal deficit present.     Mental Status: She is alert and oriented to person, place, and time.    Ortho Exam Examination of her knee she is neurovascular intact no effusion no erythema compartments are soft and compressible has a good endpoint on anterior draw more tender over the medial and lateral joint line no mechanical symptoms or signs.  Negative Homans' sign Specialty Comments:  No specialty comments available.  Imaging: No results found.   PMFS History: Patient Active Problem List   Diagnosis Date Noted  . Pain in right knee 06/09/2023  . Post-operative state 01/18/2018  . Anemia 01/07/2018  .  Symptomatic anemia 10/06/2017  . Microcytic hypochromic anemia 10/06/2017   Past Medical History:  Diagnosis Date  . Anemia   . High blood pressure   . History of blood transfusion 01/08/2018   [facility]  . SVD (spontaneous vaginal delivery)    x 3    Family History  Problem Relation Age of Onset  . Depression Mother   . Diabetes Mellitus II Mother   . Hypertension Mother   . Hypertension Sister   . Hypertension Maternal Aunt   . COPD Maternal Aunt   . Hypertension Maternal Aunt   . Hypertension Maternal Aunt   . Seizures Daughter     Past Surgical History:  Procedure Laterality Date  . CYSTOSCOPY N/A 01/18/2018   Procedure:  CYSTOSCOPY;  Surgeon: Allie Bossier, MD;  Location: [facility];  Service: Gynecology;  Laterality: N/A;  . HYSTERECTOMY ABDOMINAL WITH SALPINGECTOMY Bilateral 01/18/2018   Procedure: HYSTERECTOMY ABDOMINAL WITH SALPINGECTOMY;  Surgeon: Allie Bossier, MD;  Location: [facility];  Service: Gynecology;  Laterality: Bilateral;  . KNEE SURGERY    . TUBAL LIGATION     Social History   Occupational History  . Not on file  Tobacco Use  . Smoking status: Every Day    Current packs/day: 0.50    Average packs/day: 0.5 packs/day for 6.0 years (3.0 ttl pk-yrs)    Types: Cigarettes  . Smokeless tobacco: Never  Vaping Use  . Vaping status: Never Used  Substance and Sexual Activity  . Alcohol use: Yes    Alcohol/week: 2.0 - 3.0 standard drinks of alcohol    Types: 1 Cans of beer, 1 - 2 Shots of liquor per week    Comment: weekends  . Drug use: Yes    Types: Marijuana  . Sexual activity: Yes    Birth control/protection: Surgical

## 2023-06-11 ENCOUNTER — Encounter: Payer: Self-pay | Admitting: Family

## 2023-06-11 ENCOUNTER — Ambulatory Visit (INDEPENDENT_AMBULATORY_CARE_PROVIDER_SITE_OTHER): Payer: BC Managed Care – PPO | Admitting: Family

## 2023-06-11 VITALS — BP 136/84 | HR 79 | Temp 97.6°F | Resp 20 | Ht 62.0 in | Wt 161.0 lb

## 2023-06-11 DIAGNOSIS — I10 Essential (primary) hypertension: Secondary | ICD-10-CM

## 2023-06-11 DIAGNOSIS — E7800 Pure hypercholesterolemia, unspecified: Secondary | ICD-10-CM | POA: Diagnosis not present

## 2023-06-11 DIAGNOSIS — Z90710 Acquired absence of both cervix and uterus: Secondary | ICD-10-CM | POA: Diagnosis not present

## 2023-06-11 NOTE — Progress Notes (Signed)
Provider: Richarda Blade FNP-C  Baer Hinton, Donalee Citrin, NP  Patient Care Team: Tatayana Beshears, Donalee Citrin, NP as PCP - General (Family Medicine)  Extended Emergency Contact Information Primary Emergency Contact: Jolayne Panther Address: [address]  [address] [place] Home Phone: [phone] Mobile Phone: [phone] Relation: Daughter  Code Status:  Full Code  Goals of care: Advanced Directive information    06/11/2023   10:25 AM  Advanced Directives  Does Patient Have a Medical Advance Directive? No  Would patient like information on creating a medical advance directive? No - Patient declined     Chief Complaint  Patient presents with   Gynecologic Exam    Pap smear     HPI:  Pt is a 52 y.o. female seen today for an acute visit for pap smear. She denies any bleeding or discharge. Reports hx of total hysterectomy no need for cervical cancer screening.  Lab results reviewed and discussed during the visit.  Chol 211 and LDL 122 has changed her diet and bakes instead of deep frying foods. Glucose 112   Alt 33 states drinks 3 -4 beers over the week.   Status post steroid injection  Past Medical History:  Diagnosis Date   Anemia    High blood pressure    History of blood transfusion 01/08/2018   [facility]   SVD (spontaneous vaginal delivery)    x 3   Past Surgical History:  Procedure Laterality Date   CYSTOSCOPY N/A 01/18/2018   Procedure: CYSTOSCOPY;  Surgeon: Allie Bossier, MD;  Location: [facility];  Service: Gynecology;  Laterality: N/A;   HYSTERECTOMY ABDOMINAL WITH SALPINGECTOMY Bilateral 01/18/2018   Procedure: HYSTERECTOMY ABDOMINAL WITH SALPINGECTOMY;  Surgeon: Allie Bossier, MD;  Location: [facility];  Service: Gynecology;  Laterality: Bilateral;   KNEE SURGERY     TUBAL LIGATION      No Known Allergies  Outpatient Encounter Medications as of 06/11/2023  Medication Sig   amLODipine (NORVASC) 10 MG tablet Take 1 tablet (10 mg total) by mouth daily.    meloxicam (MOBIC) 7.5 MG tablet Take 1 tablet (7.5 mg total) by mouth daily.   methocarbamol (ROBAXIN) 500 MG tablet Take 1 tablet (500 mg total) by mouth 2 (two) times daily as needed for muscle spasms.   ferrous sulfate (FERROUSUL) 325 (65 FE) MG tablet Take 1 tablet (325 mg total) by mouth 2 (two) times daily. (Patient not taking: Reported on 06/11/2023)   No facility-administered encounter medications on file as of 06/11/2023.    Review of Systems  Constitutional:  Negative for appetite change, chills, fatigue, fever and unexpected weight change.  HENT:  Negative for congestion, dental problem, ear discharge, ear pain, facial swelling, hearing loss, nosebleeds, postnasal drip, rhinorrhea, sinus pressure, sinus pain, sneezing, sore throat, tinnitus and trouble swallowing.   Eyes:  Negative for pain, discharge, redness, itching and visual disturbance.  Respiratory:  Negative for cough, chest tightness, shortness of breath and wheezing.   Cardiovascular:  Negative for chest pain, palpitations and leg swelling.  Gastrointestinal:  Negative for abdominal distention, abdominal pain, blood in stool, constipation, diarrhea, nausea and vomiting.  Endocrine: Negative for cold intolerance, heat intolerance, polydipsia, polyphagia and polyuria.  Genitourinary:  Negative for difficulty urinating, dysuria, flank pain, frequency and urgency.  Musculoskeletal:  Negative for arthralgias, back pain, gait problem, joint swelling, myalgias, neck pain and neck stiffness.  Skin:  Negative for color change, pallor, rash and wound.  Neurological:  Negative for dizziness, syncope, speech difficulty, weakness, light-headedness,  numbness and headaches.  Hematological:  Does not bruise/bleed easily.  Psychiatric/Behavioral:  Negative for agitation, behavioral problems, confusion, hallucinations, self-injury, sleep disturbance and suicidal ideas. The patient is not nervous/anxious.      There is no immunization history  on file for this patient. Pertinent  Health Maintenance Due  Topic Date Due   Colonoscopy  Never done   MAMMOGRAM  Never done   INFLUENZA VACCINE  11/08/2023 (Originally 03/11/2023)      11/01/2019    2:44 PM 08/05/2021    8:01 PM 12/03/2021    7:40 PM 06/04/2023    9:48 AM 06/11/2023   10:23 AM  Fall Risk  Falls in the past year?    0 0  Was there an injury with Fall?    0 0  Fall Risk Category Calculator    0 0  (RETIRED) Patient Fall Risk Level Low fall risk Low fall risk Low fall risk    Patient at Risk for Falls Due to    No Fall Risks No Fall Risks  Fall risk Follow up    Falls evaluation completed    Functional Status Survey:    Vitals:   06/11/23 1027  BP: 136/84  Pulse: 79  Resp: 20  Temp: 97.6 F (36.4 C)  SpO2: 98%  Weight: 161 lb (73 kg)  Height: 5\' 2"  (1.575 m)   Body mass index is 29.45 kg/m. Physical Exam Vitals reviewed.  Constitutional:      General: She is not in acute distress.    Appearance: Normal appearance. She is overweight. She is not ill-appearing or diaphoretic.  HENT:     Head: Normocephalic.     Right Ear: Tympanic membrane, ear canal and external ear normal. There is no impacted cerumen.     Left Ear: Tympanic membrane, ear canal and external ear normal. There is no impacted cerumen.     Nose: Nose normal. No congestion or rhinorrhea.     Mouth/Throat:     Mouth: Mucous membranes are moist.     Pharynx: Oropharynx is clear. No oropharyngeal exudate or posterior oropharyngeal erythema.  Eyes:     General: No scleral icterus.       Right eye: No discharge.        Left eye: No discharge.     Extraocular Movements: Extraocular movements intact.     Conjunctiva/sclera: Conjunctivae normal.     Pupils: Pupils are equal, round, and reactive to light.  Neck:     Vascular: No carotid bruit.  Cardiovascular:     Rate and Rhythm: Normal rate and regular rhythm.     Pulses: Normal pulses.     Heart sounds: Normal heart sounds. No murmur  heard.    No friction rub. No gallop.  Pulmonary:     Effort: Pulmonary effort is normal. No respiratory distress.     Breath sounds: Normal breath sounds. No wheezing, rhonchi or rales.  Chest:     Chest wall: No tenderness.  Abdominal:     General: Bowel sounds are normal. There is no distension.     Palpations: Abdomen is soft. There is no mass.     Tenderness: There is no abdominal tenderness. There is no right CVA tenderness, left CVA tenderness, guarding or rebound.  Musculoskeletal:        General: No swelling or tenderness. Normal range of motion.     Cervical back: Normal range of motion. No rigidity or tenderness.     Right lower leg:  No edema.     Left lower leg: No edema.  Lymphadenopathy:     Cervical: No cervical adenopathy.  Skin:    General: Skin is warm and dry.     Coloration: Skin is not pale.     Findings: No bruising, erythema, lesion or rash.  Neurological:     Mental Status: She is alert and oriented to person, place, and time.     Cranial Nerves: No cranial nerve deficit.     Sensory: No sensory deficit.     Motor: No weakness.     Coordination: Coordination normal.     Gait: Gait normal.  Psychiatric:        Mood and Affect: Mood normal.        Speech: Speech normal.        Behavior: Behavior normal.        Thought Content: Thought content normal.        Judgment: Judgment normal.     Labs reviewed: Recent Labs    05/19/23 1937 06/04/23 1057  NA 144 142  K 3.5 4.3  CL 104 105  CO2 21 29  GLUCOSE 89 112*  BUN 18 19  CREATININE 0.59 0.48*  CALCIUM 10.0 10.1   Recent Labs    06/04/23 1057  AST 21  ALT 33*  BILITOT 0.5  PROT 7.5   Recent Labs    06/04/23 1057  WBC 7.1  NEUTROABS 3,458  HGB 13.1  HCT 40.5  MCV 87.3  PLT 401*   Lab Results  Component Value Date   TSH 0.87 06/04/2023   No results found for: "HGBA1C" Lab Results  Component Value Date   CHOL 211 (H) 06/04/2023   HDL 70 06/04/2023   LDLCALC 122 (H)  06/04/2023   TRIG 89 06/04/2023   CHOLHDL 3.0 06/04/2023    Significant Diagnostic Results in last 30 days:  DG Knee Complete 4 Views Right  Result Date: 05/19/2023 CLINICAL DATA:  Knee pain and swelling for 2-3 weeks. EXAM: RIGHT KNEE - COMPLETE 4+ VIEW COMPARISON:  11/01/2019 FINDINGS: No acute fracture or dislocation. Prior ACL repair. Mild peripheral spurring with preservation of joint spaces. Minimal knee joint effusion. No erosive change or focal bone abnormality. IMPRESSION: 1. Mild osteoarthritis with minimal knee joint effusion. 2. Prior ACL repair. Electronically Signed   By: Narda Rutherford M.D.   On: 05/19/2023 21:12    Assessment/Plan 1. Essential hypertension Blood pressure well-controlled -On Amlodipine -dietary Modification and exercise advised - TSH; Future - COMPLETE METABOLIC PANEL WITH GFR; Future - CBC with Differential/Platelet; Future  2. Pure hypercholesterolemia LDL not at goal - Recommend a low saturated fats, low carbohydrates and high vegetable diet also exercise at least 3 times per week for 30 minutes. - Lipid panel; Future  3. Hx of hysterectomy, total Reports total hysterectomy no need for cervical cancer screening with pap smear   Family/ staff Communication: Reviewed plan of care with patient verbalized understanding  Labs/tests ordered:  - TSH; Future - COMPLETE METABOLIC PANEL WITH GFR; Future - CBC with Differential/Platelet; Future - Lipid panel; Future  Next Appointment: Return in 6 months (on 12/01/2023), or if symptoms worsen or fail to improve, for fasting labs prior to visit.   Caesar Bookman, NP

## 2023-06-13 DIAGNOSIS — F17209 Nicotine dependence, unspecified, with unspecified nicotine-induced disorders: Secondary | ICD-10-CM | POA: Insufficient documentation

## 2023-06-13 DIAGNOSIS — I10 Essential (primary) hypertension: Secondary | ICD-10-CM | POA: Insufficient documentation

## 2023-06-13 DIAGNOSIS — F1290 Cannabis use, unspecified, uncomplicated: Secondary | ICD-10-CM | POA: Insufficient documentation

## 2023-06-13 DIAGNOSIS — Z789 Other specified health status: Secondary | ICD-10-CM | POA: Insufficient documentation

## 2023-06-20 DIAGNOSIS — E7800 Pure hypercholesterolemia, unspecified: Secondary | ICD-10-CM | POA: Insufficient documentation

## 2023-06-22 ENCOUNTER — Telehealth: Payer: Self-pay | Admitting: Physical Therapy

## 2023-06-22 NOTE — Telephone Encounter (Signed)
Patients insurance is OON with our facility. I called and informed that her has an [facility] plan and she can contact her dr to see if they can send her somewhere within [facility]. She stated that she will call her doctor because she can not afford to pay for the therapy here.

## 2023-06-25 ENCOUNTER — Ambulatory Visit: Payer: BLUE CROSS/BLUE SHIELD | Admitting: Physical Therapy

## 2023-07-12 ENCOUNTER — Ambulatory Visit (INDEPENDENT_AMBULATORY_CARE_PROVIDER_SITE_OTHER): Payer: BLUE CROSS/BLUE SHIELD | Admitting: Physician Assistant

## 2023-07-12 ENCOUNTER — Encounter: Payer: Self-pay | Admitting: Physician Assistant

## 2023-07-12 DIAGNOSIS — M25561 Pain in right knee: Secondary | ICD-10-CM | POA: Diagnosis not present

## 2023-07-12 MED ORDER — MELOXICAM 7.5 MG PO TABS: 7.5000 mg | ORAL_TABLET | Freq: Every day | ORAL | 0 refills | Status: DC

## 2023-07-12 NOTE — Progress Notes (Signed)
Office Visit Note   Patient: Amanda Casey           Date of Birth: 1970/11/15           MRN: [id number] Visit Date: 07/12/2023              Requested by: Amanda Bookman, NP [address] [place],  [place] [address] PCP: Amanda Bookman, NP  Chief Complaint  Patient presents with   Right Knee - Follow-up      HPI: Amanda Casey is a pleasant 52 year old woman with previous ACL reconstruction on her right knee.  She has been taking meloxicam and felt like Voltaren gel and the steroid injection helped her.  She still has some grinding and anterior medial knee pain.  She was contacted by physical therapy but unfortunately is not covered by her insurance.  Assessment & Plan: Visit Diagnoses: Right knee pain  Plan: Had a long discussion with her we will continue with the meloxicam as her kidney function is fine for the next 4 weeks.  Will continue with Voltaren.  I discussed doing a home exercise program with her and she is willing to do this.  She was given complete instructions and I reviewed all of the exercises I want her to do 3-4 times a week.  Also gave her some Thera-Band.  If she does no better she is going to contact me in 4 weeks and an MRI will be ordered  Follow-Up Instructions: No follow-ups on file.   Ortho Exam  Patient is alert, oriented, no adenopathy, well-dressed, normal affect, normal respiratory effort. Examination of her right knee she has no effusion she is neurovascular intact she does have quite a bit of grinding with range of motion good stability overall she is neurovascularly intact no erythema compartments are soft and compressible  Imaging: No results found. No images are attached to the encounter.  Labs: No results found for: "HGBA1C", "ESRSEDRATE", "CRP", "LABURIC", "REPTSTATUS", "GRAMSTAIN", "CULT", "LABORGA"   No results found for: "ALBUMIN", "PREALBUMIN", "CBC"  No results found for: "MG" No results found for: "VD25OH"  No results found  for: "PREALBUMIN"    Latest Ref Rng & Units 06/04/2023   10:57 AM 03/03/2018   11:41 AM 01/19/2018    5:48 AM  CBC EXTENDED  WBC 3.8 - 10.8 Thousand/uL 7.1  9.6  17.8   RBC 3.80 - 5.10 Million/uL 4.64  4.81  3.15   Hemoglobin 11.7 - 15.5 g/dL 29.5  18.8  8.1   HCT 41.6 - 45.0 % 40.5  40.4  25.4   Platelets 140 - 400 Thousand/uL 401  472  305   NEUT# 1,500 - 7,800 cells/uL 3,458        There is no height or weight on file to calculate BMI.  Orders:  No orders of the defined types were placed in this encounter.  Meds ordered this encounter  Medications   meloxicam (MOBIC) 7.5 MG tablet    Sig: Take 1 tablet (7.5 mg total) by mouth daily.    Dispense:  30 tablet    Refill:  0     Procedures: No procedures performed  Clinical Data: No additional findings.  ROS:  All other systems negative, except as noted in the HPI. Review of Systems  Objective: Vital Signs: LMP  (LMP Unknown) Comment: cont. bleeding since Feb 2019  Specialty Comments:  No specialty comments available.  PMFS History: Patient Active Problem List   Diagnosis Date Noted  Pure hypercholesterolemia 06/20/2023   Essential hypertension 06/13/2023   Tobacco use disorder, continuous 06/13/2023   Alcohol use 06/13/2023   Marijuana smoker, episodic 06/13/2023   Pain in right knee 06/09/2023   Post-operative state 01/18/2018   Anemia 01/07/2018   Symptomatic anemia 10/06/2017   Microcytic hypochromic anemia 10/06/2017   Past Medical History:  Diagnosis Date   Anemia    High blood pressure    History of blood transfusion 01/08/2018   [facility]   SVD (spontaneous vaginal delivery)    x 3    Family History  Problem Relation Age of Onset   Depression Mother    Diabetes Mellitus II Mother    Hypertension Mother    Hypertension Sister    Hypertension Maternal Aunt    COPD Maternal Aunt    Hypertension Maternal Aunt    Hypertension Maternal Aunt    Seizures Daughter     Past Surgical History:   Procedure Laterality Date   CYSTOSCOPY N/A 01/18/2018   Procedure: CYSTOSCOPY;  Surgeon: Allie Bossier, MD;  Location: [facility];  Service: Gynecology;  Laterality: N/A;   HYSTERECTOMY ABDOMINAL WITH SALPINGECTOMY Bilateral 01/18/2018   Procedure: HYSTERECTOMY ABDOMINAL WITH SALPINGECTOMY;  Surgeon: Allie Bossier, MD;  Location: [facility];  Service: Gynecology;  Laterality: Bilateral;   KNEE SURGERY     TUBAL LIGATION     Social History   Occupational History   Not on file  Tobacco Use   Smoking status: Every Day    Current packs/day: 0.50    Average packs/day: 0.5 packs/day for 6.0 years (3.0 ttl pk-yrs)    Types: Cigarettes   Smokeless tobacco: Never  Vaping Use   Vaping status: Never Used  Substance and Sexual Activity   Alcohol use: Yes    Alcohol/week: 2.0 - 3.0 standard drinks of alcohol    Types: 1 Cans of beer, 1 - 2 Shots of liquor per week    Comment: weekends   Drug use: Yes    Types: Marijuana   Sexual activity: Yes    Birth control/protection: Surgical

## 2023-08-15 ENCOUNTER — Other Ambulatory Visit: Payer: Self-pay | Admitting: Physician Assistant

## 2023-08-19 ENCOUNTER — Telehealth: Payer: Self-pay | Admitting: Orthopedic Surgery

## 2023-08-19 ENCOUNTER — Encounter: Payer: Self-pay | Admitting: Family

## 2023-08-19 ENCOUNTER — Other Ambulatory Visit: Payer: Self-pay

## 2023-08-19 MED ORDER — AMLODIPINE BESYLATE 10 MG PO TABS
10.0000 mg | ORAL_TABLET | Freq: Every day | ORAL | 0 refills | Status: DC
Start: 2023-08-19 — End: 2023-11-15

## 2023-08-19 NOTE — Telephone Encounter (Signed)
 Ms. Hawes is calling in to request a refill of meloxicam be called into the [facility] pharmacy on [address].  Call back # is [phone].

## 2023-08-20 ENCOUNTER — Other Ambulatory Visit: Payer: Self-pay | Admitting: Physician Assistant

## 2023-08-20 MED ORDER — MELOXICAM 7.5 MG PO TABS: 7.5000 mg | ORAL_TABLET | Freq: Every day | ORAL | 0 refills | Status: DC

## 2023-09-13 ENCOUNTER — Other Ambulatory Visit: Payer: Self-pay | Admitting: Physician Assistant

## 2023-10-14 ENCOUNTER — Other Ambulatory Visit: Payer: Self-pay | Admitting: Physician Assistant

## 2023-10-19 ENCOUNTER — Ambulatory Visit (INDEPENDENT_AMBULATORY_CARE_PROVIDER_SITE_OTHER): Admitting: Physician Assistant

## 2023-10-19 ENCOUNTER — Encounter: Payer: Self-pay | Admitting: Physician Assistant

## 2023-10-19 ENCOUNTER — Other Ambulatory Visit (INDEPENDENT_AMBULATORY_CARE_PROVIDER_SITE_OTHER): Payer: Self-pay

## 2023-10-19 DIAGNOSIS — M25561 Pain in right knee: Secondary | ICD-10-CM

## 2023-10-19 MED ORDER — LIDOCAINE HCL 1 % IJ SOLN
3.0000 mL | INTRAMUSCULAR | Status: AC | PRN
  Administered 2023-10-19: 3 mL

## 2023-10-19 MED ORDER — HYDROCODONE-ACETAMINOPHEN 5-325 MG PO TABS: 1.0000 | ORAL_TABLET | Freq: Four times a day (QID) | ORAL | 0 refills | Status: DC | PRN

## 2023-10-19 NOTE — Progress Notes (Signed)
 Office Visit Note   Patient: Amanda Casey           Date of Birth: 02/22/1971           MRN: [id number] Visit Date: 10/19/2023              Requested by: Caesar Bookman, NP [address] [place],  [place] [address] PCP: Caesar Bookman, NP   Assessment & Plan: Visit Diagnoses:  1. Right knee pain, unspecified chronicity     Plan: Patient is a pleasant 53 year old woman who comes in today with acute onset of right knee pain.  She had a fall onto her right knee a couple weeks ago.  I did see her last December for right knee pain she is status post ACL reconstruction when she was younger.  At that time she was having some difficulties with her knee but was seem to be doing better with meloxicam.  She complains mostly of pain over the medial side of the right knee.  I cannot appreciate any acute fractures however she is in significant pain and difficulty bearing weight.  Given she has been having trouble with this knee and has been doing exercises and taking meloxicam I recommend an MRI.  Will review this with Dr. August Saucer or Dr. Roda Shutters.  Will attempt aspiration today but only at aspirate about 5 cc however did inject her with some Toradol hopefully this will help her.  I will call her in a few hydrocodone and I have asked that she use crutches as needed  Follow-Up Instructions: After MRI  Orders:  Orders Placed This Encounter  Procedures   XR KNEE 3 VIEW RIGHT   MR Knee Right w/o contrast   No orders of the defined types were placed in this encounter.     Procedures: Large Joint Inj: R knee on 10/19/2023 2:28 PM Indications: pain and diagnostic evaluation Details: 25 G 1.5 in needle, superolateral approach  Arthrogram: No  Medications: 3 mL lidocaine 1 % Aspirate: serous Outcome: tolerated well, no immediate complications  After obtaining verbal consent the superior lateral pouch was prepped with alcohol and Betadine.  3 cc of lidocaine plain was injected.  After adequate analgesia  it was reprepped and aspiration attempt was made 5 cc of serous fluid was aspirated.  She was then injected with 30 cc of Toradol Band-Aid and Ace wrap were applied Procedure, treatment alternatives, risks and benefits explained, specific risks discussed. Consent was given by the patient. Immediately prior to procedure a time out was called to verify the correct patient, procedure, equipment, support staff and site/side marked as required. Patient was prepped and draped in the usual sterile fashion.       Clinical Data: No additional findings.   Subjective: Chief Complaint  Patient presents with   Right Knee - Pain    HPI pleasant 53 year old woman comes in today with acute onset of right medial knee pain after falling onto her knee.  She has difficulty walking straight and bending her knee  Review of Systems  All other systems reviewed and are negative.    Objective: Vital Signs: LMP  (LMP Unknown) Comment: cont. bleeding since Feb 2019  Physical Exam Constitutional:      Appearance: Normal appearance.  Pulmonary:     Effort: Pulmonary effort is normal.  Skin:    General: Skin is warm and dry.  Neurological:     Mental Status: She is alert.  Psychiatric:  Mood and Affect: Mood normal.        Behavior: Behavior normal.     Ortho Exam Right knee she is guarding quite a bit she has difficulty flexing extending her knee focal and focusing on the medial joint line.  Good varus valgus stability she has some increased translation with anterior draw but again she is guarding compartments are soft and nontender small effusion she is neurovascular intact Specialty Comments:  No specialty comments available.  Imaging: XR KNEE 3 VIEW RIGHT Result Date: 10/19/2023 Radiographs of her right knee demonstrate findings consistent with previous ACL reconstruction.  Cannot appreciate any acute changes    PMFS History: Patient Active Problem List   Diagnosis Date Noted    Pure hypercholesterolemia 06/20/2023   Essential hypertension 06/13/2023   Tobacco use disorder, continuous 06/13/2023   Alcohol use 06/13/2023   Marijuana smoker, episodic 06/13/2023   Pain in right knee 06/09/2023   Post-operative state 01/18/2018   Anemia 01/07/2018   Symptomatic anemia 10/06/2017   Microcytic hypochromic anemia 10/06/2017   Past Medical History:  Diagnosis Date   Anemia    High blood pressure    History of blood transfusion 01/08/2018   [facility]   SVD (spontaneous vaginal delivery)    x 3    Family History  Problem Relation Age of Onset   Depression Mother    Diabetes Mellitus II Mother    Hypertension Mother    Hypertension Sister    Hypertension Maternal Aunt    COPD Maternal Aunt    Hypertension Maternal Aunt    Hypertension Maternal Aunt    Seizures Daughter     Past Surgical History:  Procedure Laterality Date   CYSTOSCOPY N/A 01/18/2018   Procedure: CYSTOSCOPY;  Surgeon: Allie Bossier, MD;  Location: [facility];  Service: Gynecology;  Laterality: N/A;   HYSTERECTOMY ABDOMINAL WITH SALPINGECTOMY Bilateral 01/18/2018   Procedure: HYSTERECTOMY ABDOMINAL WITH SALPINGECTOMY;  Surgeon: Allie Bossier, MD;  Location: [facility];  Service: Gynecology;  Laterality: Bilateral;   KNEE SURGERY     TUBAL LIGATION     Social History   Occupational History   Not on file  Tobacco Use   Smoking status: Every Day    Current packs/day: 0.50    Average packs/day: 0.5 packs/day for 6.0 years (3.0 ttl pk-yrs)    Types: Cigarettes   Smokeless tobacco: Never  Vaping Use   Vaping status: Never Used  Substance and Sexual Activity   Alcohol use: Yes    Alcohol/week: 2.0 - 3.0 standard drinks of alcohol    Types: 1 Cans of beer, 1 - 2 Shots of liquor per week    Comment: weekends   Drug use: Yes    Types: Marijuana   Sexual activity: Yes    Birth control/protection: Surgical

## 2023-10-19 NOTE — Addendum Note (Signed)
 Addended by: Polly Cobia on: 10/19/2023 02:32 PM   Modules accepted: Orders

## 2023-10-25 ENCOUNTER — Telehealth: Payer: Self-pay | Admitting: Physician Assistant

## 2023-10-25 NOTE — Telephone Encounter (Signed)
 Refill medication. Meloxicam. [facility] on [address], [place], [place] [address]

## 2023-10-25 NOTE — Telephone Encounter (Signed)
 Amanda Casey, I think this is a patient of yours

## 2023-10-26 ENCOUNTER — Other Ambulatory Visit: Payer: Self-pay | Admitting: Physician Assistant

## 2023-10-26 MED ORDER — MELOXICAM 7.5 MG PO TABS: 7.5000 mg | ORAL_TABLET | Freq: Every day | ORAL | 0 refills | Status: DC

## 2023-10-28 ENCOUNTER — Other Ambulatory Visit: Payer: Self-pay | Admitting: Physician Assistant

## 2023-11-13 ENCOUNTER — Other Ambulatory Visit: Payer: Self-pay | Admitting: Family

## 2023-11-15 ENCOUNTER — Other Ambulatory Visit: Payer: Self-pay | Admitting: Family

## 2023-11-15 NOTE — Telephone Encounter (Signed)
 Copied from CRM [id number]. Topic: Clinical - Medication Refill >> Nov 15, 2023  1:04 PM Adrianna P wrote: Most Recent Primary Care Visit:  Provider: Richarda Blade C  Department: [facility]  Visit Type: OFFICE VISIT  Date: 06/11/2023  Medication: amLODipine (NORVASC) 10 MG tablet  Has the patient contacted their pharmacy? Yes (Agent: If no, request that the patient contact the pharmacy for the refill. If patient does not wish to contact the pharmacy document the reason why and proceed with request.) (Agent: If yes, when and what did the pharmacy advise?)  Is this the correct pharmacy for this prescription? Yes If no, delete pharmacy and type the correct one.  This is the patient's preferred pharmacy:  [id number] - [place], [place] - [address] [place] [address] Phone: [phone] Fax: [phone]  [id number] [place], [place] - [address] [place] [address] Phone: [phone] Fax: [phone]   Has the prescription been filled recently? No  Is the patient out of the medication? Yes  Has the patient been seen for an appointment in the last year OR does the patient have an upcoming appointment? Yes  Can we respond through MyChart? No  Agent: Please be advised that Rx refills may take up to 3 business days. We ask that you follow-up with your pharmacy.

## 2023-12-01 ENCOUNTER — Other Ambulatory Visit: Payer: BC Managed Care – PPO

## 2023-12-01 DIAGNOSIS — E7800 Pure hypercholesterolemia, unspecified: Secondary | ICD-10-CM

## 2023-12-01 DIAGNOSIS — I10 Essential (primary) hypertension: Secondary | ICD-10-CM

## 2023-12-03 ENCOUNTER — Encounter: Payer: Self-pay | Admitting: Family

## 2023-12-03 ENCOUNTER — Ambulatory Visit (INDEPENDENT_AMBULATORY_CARE_PROVIDER_SITE_OTHER): Payer: BC Managed Care – PPO | Admitting: Family

## 2023-12-03 VITALS — BP 134/84 | HR 71 | Temp 97.8°F | Resp 20 | Ht 62.0 in | Wt 160.2 lb

## 2023-12-03 DIAGNOSIS — I10 Essential (primary) hypertension: Secondary | ICD-10-CM | POA: Diagnosis not present

## 2023-12-03 DIAGNOSIS — F17209 Nicotine dependence, unspecified, with unspecified nicotine-induced disorders: Secondary | ICD-10-CM | POA: Diagnosis not present

## 2023-12-03 DIAGNOSIS — E7800 Pure hypercholesterolemia, unspecified: Secondary | ICD-10-CM | POA: Diagnosis not present

## 2023-12-03 MED ORDER — AMLODIPINE BESYLATE 10 MG PO TABS: 10.0000 mg | ORAL_TABLET | Freq: Every day | ORAL | 1 refills | Status: DC

## 2023-12-03 NOTE — Progress Notes (Signed)
 Provider: Christean Courts FNP-C   Lorayne Getchell, Elijio Guadeloupe, NP  Patient Care Team: Talik Casique, Elijio Guadeloupe, NP as PCP - General (Family Medicine)  Extended Emergency Contact Information Primary Emergency Contact: Yancy Heller Address: [address]  [address] [place] Home Phone: [phone] Mobile Phone: [phone] Relation: Daughter  Code Status:  Full Code  Goals of care: Advanced Directive information    12/03/2023   10:18 AM  Advanced Directives  Does Patient Have a Medical Advance Directive? No  Would patient like information on creating a medical advance directive? No - Patient declined     Chief Complaint  Patient presents with   Medical Management of Chronic Issues    6 month follow up.     Discussed the use of AI scribe software for clinical note transcription with the patient, who gave verbal consent to proceed.  History of Present Illness   Amanda Casey is a 53 year old female who presents for a six-month follow-up visit.  She has been experiencing fatigue due to working seven days a week with twelve-hour shifts. 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 She remains active by spending time at the playground daily with her grandchildren.  She continues to take Robaxin  500 mg as needed for muscle relaxation and is under Orthopedic care. She was prescribed Vicodin for knee pain but has not been taking it due to drowsiness. Gets from over-the-counter medications. Her right knee, which has pins and screws, was previously drained due to fluid accumulation, and she received a prescription for pain pills. Since the drainage, there have been no further issues with the right knee.  Her left knee has started to swell, possibly due to compensating for the right knee. She notes a small amount of fluid in the left knee and is awaiting an MRI for both knees. She takes meloxicam  7.5 mg daily for inflammation.  She takes  amlodipine  10 mg daily for blood pressure management but did not take it on the morning of the visit. No headaches, dizziness, chest pain, shortness of breath, or palpitations. Her blood pressure at the visit was 134/84 mmHg.  She reports regular bowel movements and no issues with urination. She sleeps well at night despite her busy schedule.  She smokes less than a pack a day and acknowledges increased smoking due to recent stress from the loss of several friends. She is trying to cut down on smoking.  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   Past Medical History:  Diagnosis Date   Anemia    High blood pressure    History of blood transfusion 01/08/2018   [facility]   SVD (spontaneous vaginal delivery)    x 3   Past Surgical History:  Procedure Laterality Date   CYSTOSCOPY N/A 01/18/2018   Procedure: CYSTOSCOPY;  Surgeon: Ana Balling, MD;  Location: [facility];  Service: Gynecology;  Laterality: N/A;   HYSTERECTOMY ABDOMINAL WITH SALPINGECTOMY Bilateral 01/18/2018   Procedure: HYSTERECTOMY ABDOMINAL WITH SALPINGECTOMY;  Surgeon: Ana Balling, MD;  Location: [facility];  Service: Gynecology;  Laterality: Bilateral;   KNEE SURGERY     TUBAL LIGATION      No Known Allergies  Allergies as of 12/03/2023   No Known Allergies      Medication List        Accurate as of  December 03, 2023 11:52 AM. If you have any questions, ask your nurse or doctor.          amLODipine  10 MG tablet Commonly known as: NORVASC  Take 1 tablet (10 mg total) by mouth daily.   HYDROcodone -acetaminophen  5-325 MG tablet Commonly known as: NORCO/VICODIN Take 1 tablet by mouth every 6 (six) hours as needed for moderate pain (pain score 4-6).   meloxicam  7.5 MG tablet Commonly known as: MOBIC  Take 1 tablet (7.5 mg total) by mouth daily.   methocarbamol  500 MG tablet Commonly known as:  ROBAXIN  Take 1 tablet (500 mg total) by mouth 2 (two) times daily as needed for muscle spasms.        Review of Systems  Constitutional:  Negative for appetite change, chills, fatigue, fever and unexpected weight change.  HENT:  Negative for congestion, dental problem, ear discharge, ear pain, facial swelling, hearing loss, nosebleeds, postnasal drip, rhinorrhea, sinus pressure, sinus pain, sneezing, sore throat, tinnitus and trouble swallowing.   Eyes:  Negative for pain, discharge, redness, itching and visual disturbance.  Respiratory:  Negative for cough, chest tightness, shortness of breath and wheezing.   Cardiovascular:  Negative for chest pain, palpitations and leg swelling.  Gastrointestinal:  Negative for abdominal distention, abdominal pain, blood in stool, constipation, diarrhea, nausea and vomiting.  Endocrine: Negative for cold intolerance, heat intolerance, polydipsia, polyphagia and polyuria.  Genitourinary:  Negative for difficulty urinating, dysuria, flank pain, frequency and urgency.  Musculoskeletal:  Positive for arthralgias. Negative for back pain, gait problem, joint swelling, myalgias, neck pain and neck stiffness.       Bilateral knee pain   Skin:  Negative for color change, pallor, rash and wound.  Neurological:  Negative for dizziness, syncope, speech difficulty, weakness, light-headedness, numbness and headaches.  Hematological:  Does not bruise/bleed easily.  Psychiatric/Behavioral:  Negative for agitation, behavioral problems, confusion, hallucinations, self-injury, sleep disturbance and suicidal ideas. The patient is not nervous/anxious.      There is no immunization history on file for this patient. Pertinent  Health Maintenance Due  Topic Date Due   Colonoscopy  Never done   MAMMOGRAM  Never done   INFLUENZA VACCINE  03/10/2024      08/05/2021    8:01 PM 12/03/2021    7:40 PM 06/04/2023    9:48 AM 06/11/2023   10:23 AM 12/03/2023   10:18 AM  Fall  Risk  Falls in the past year?   0 0 0  Was there an injury with Fall?   0 0 0  Fall Risk Category Calculator   0 0 0  (RETIRED) Patient Fall Risk Level Low fall risk Low fall risk     Patient at Risk for Falls Due to   No Fall Risks No Fall Risks No Fall Risks  Fall risk Follow up   Falls evaluation completed  Falls evaluation completed   Functional Status Survey:    Vitals:   12/03/23 1020  BP: 134/84  Pulse: 71  Resp: 20  Temp: 97.8 F (36.6 C)  SpO2: 98%  Weight: 160 lb 3.2 oz (72.7 kg)  Height: 5\' 2"  (1.575 m)   Body mass index is 29.3 kg/m. Physical Exam  VITALS: T- 97.8, P- 71, BP- 134/84, SaO2- 98% MEASUREMENTS: Weight- 160.2. GENERAL: Alert, cooperative, well developed, no acute distress. HEENT: Normocephalic, normal oropharynx, moist mucous membranes, ears normal bilaterally, nose normal, vision grossly intact. NECK: Neck normal. CHEST: Clear to auscultation bilaterally, no wheezes, rhonchi, or crackles. CARDIOVASCULAR: Normal heart  rate and rhythm, S1 and S2 normal without murmurs. ABDOMEN: Soft, non-tender, non-distended, without organomegaly, normal bowel sounds. EXTREMITIES: No cyanosis or edema, no swelling, numbness, or tingling in legs. MUSCULOSKELETAL: Knee normal range of motion, knee crepitus noted, shoulders without pain, normal range of motion. NEUROLOGICAL: Cranial nerves grossly intact, moves all extremities without gross motor or sensory deficit.  SKIN: No rash,no lesion or erythema   PSYCHIATRY/BEHAVIORAL: Mood stable    Labs reviewed: Recent Labs    05/19/23 1937 06/04/23 1057  NA 144 142  K 3.5 4.3  CL 104 105  CO2 21 29  GLUCOSE 89 112*  BUN 18 19  CREATININE 0.59 0.48*  CALCIUM 10.0 10.1   Recent Labs    06/04/23 1057  AST 21  ALT 33*  BILITOT 0.5  PROT 7.5   Recent Labs    06/04/23 1057  WBC 7.1  NEUTROABS 3,458  HGB 13.1  HCT 40.5  MCV 87.3  PLT 401*   Lab Results  Component Value Date   TSH 0.87 06/04/2023    No results found for: "HGBA1C" Lab Results  Component Value Date   CHOL 211 (H) 06/04/2023   HDL 70 06/04/2023   LDLCALC 122 (H) 06/04/2023   TRIG 89 06/04/2023   CHOLHDL 3.0 06/04/2023    Significant Diagnostic Results in last 30 days:  No results found.  Assessment/Plan  Right knee pain with fluid accumulation Recent drainage of fluid with resolution of pain. Cortisone injection avoided due to risk of hitting the screw in the knee. - Continue follow-up with orthopedic care.  Left knee swelling with possible effusion Swelling likely due to compensatory pressure from the right knee. MRI planned for further assessment. Visible fluid accumulation during examination. - Await scheduling of MRI for both knees.  Hypertension Blood pressure well-controlled at 134/84 mmHg. No symptoms such as headache, dizziness, chest pain, shortness of breath, or palpitations. Continues on amlodipine  10 mg daily. - Prescribe 90-day supply of amlodipine  with one refill.  Tobacco use Continues to smoke, increased due to stress from recent personal losses. Discussed importance of reducing smoking to prevent long-term health issues such as oxygen therapy. - Encourage reduction in smoking. - Consider counseling services for stress management.   Family/ staff Communication: Reviewed plan of care with patient verbalized understanding   Labs/tests ordered: Has fasting labs orders in place   Next Appointment : Return in about 6 months (around 06/03/2024) for medical mangement of chronic issues.Aaron Aas   Spent 30 minutes of Face to face and non-face to face with patient  >50% time spent counseling; reviewing medical record; tests; labs; documentation and developing future plan of care.   Estil Heman, NP

## 2023-12-04 LAB — COMPLETE METABOLIC PANEL WITHOUT GFR
AG Ratio: 1.8 (calc) (ref 1.0–2.5)
ALT: 18 U/L (ref 6–29)
AST: 17 U/L (ref 10–35)
Albumin: 4.6 g/dL (ref 3.6–5.1)
Alkaline phosphatase (APISO): 91 U/L (ref 37–153)
BUN/Creatinine Ratio: 31 (calc) — ABNORMAL HIGH (ref 6–22)
BUN: 12 mg/dL (ref 7–25)
CO2: 29 mmol/L (ref 20–32)
Calcium: 9.9 mg/dL (ref 8.6–10.4)
Chloride: 104 mmol/L (ref 98–110)
Creat: 0.39 mg/dL — ABNORMAL LOW (ref 0.50–1.03)
Globulin: 2.5 g/dL (ref 1.9–3.7)
Glucose, Bld: 107 mg/dL — ABNORMAL HIGH (ref 65–99)
Potassium: 3.9 mmol/L (ref 3.5–5.3)
Sodium: 141 mmol/L (ref 135–146)
Total Bilirubin: 0.4 mg/dL (ref 0.2–1.2)
Total Protein: 7.1 g/dL (ref 6.1–8.1)

## 2023-12-04 LAB — LIPID PANEL
Cholesterol: 192 mg/dL (ref <200)
HDL: 62 mg/dL (ref >=50)
LDL Cholesterol (Calc): 106 mg/dL — ABNORMAL HIGH
Non-HDL Cholesterol (Calc): 130 mg/dL — ABNORMAL HIGH (ref <130)
Total CHOL/HDL Ratio: 3.1 (calc) (ref <5.0)
Triglycerides: 129 mg/dL (ref <150)

## 2023-12-04 LAB — CBC WITH DIFFERENTIAL/PLATELET
Absolute Lymphocytes: 2506 {cells}/uL (ref 850–3900)
Absolute Monocytes: 442 {cells}/uL (ref 200–950)
Basophils Absolute: 34 {cells}/uL (ref 0–200)
Basophils Relative: 0.5 %
Eosinophils Absolute: 101 {cells}/uL (ref 15–500)
Eosinophils Relative: 1.5 %
HCT: 39.4 % (ref 35.0–45.0)
Hemoglobin: 12.7 g/dL (ref 11.7–15.5)
MCH: 28.1 pg (ref 27.0–33.0)
MCHC: 32.2 g/dL (ref 32.0–36.0)
MCV: 87.2 fL (ref 80.0–100.0)
MPV: 9.9 fL (ref 7.5–12.5)
Monocytes Relative: 6.6 %
Neutro Abs: 3618 {cells}/uL (ref 1500–7800)
Neutrophils Relative %: 54 %
Platelets: 423 10*3/uL — ABNORMAL HIGH (ref 140–400)
RBC: 4.52 10*6/uL (ref 3.80–5.10)
RDW: 14.9 % (ref 11.0–15.0)
Total Lymphocyte: 37.4 %
WBC: 6.7 10*3/uL (ref 3.8–10.8)

## 2023-12-04 LAB — TSH: TSH: 0.97 m[IU]/L

## 2023-12-10 ENCOUNTER — Emergency Department (HOSPITAL_COMMUNITY)

## 2023-12-10 ENCOUNTER — Encounter (HOSPITAL_COMMUNITY): Payer: Self-pay | Admitting: Emergency Medicine

## 2023-12-10 ENCOUNTER — Emergency Department (HOSPITAL_COMMUNITY)
Admission: EM | Admit: 2023-12-10 | Discharge: 2023-12-11 | Disposition: A | Discharge status: Home / Self Care | Attending: Emergency Medicine | Admitting: Emergency Medicine

## 2023-12-10 ENCOUNTER — Other Ambulatory Visit: Payer: Self-pay

## 2023-12-10 DIAGNOSIS — Z79899 Other long term (current) drug therapy: Secondary | ICD-10-CM | POA: Insufficient documentation

## 2023-12-10 DIAGNOSIS — Z041 Encounter for examination and observation following transport accident: Secondary | ICD-10-CM | POA: Diagnosis not present

## 2023-12-10 DIAGNOSIS — Y92410 Unspecified street and highway as the place of occurrence of the external cause: Secondary | ICD-10-CM | POA: Diagnosis not present

## 2023-12-10 DIAGNOSIS — I700 Atherosclerosis of aorta: Secondary | ICD-10-CM | POA: Diagnosis not present

## 2023-12-10 DIAGNOSIS — R109 Unspecified abdominal pain: Secondary | ICD-10-CM | POA: Insufficient documentation

## 2023-12-10 DIAGNOSIS — D649 Anemia, unspecified: Secondary | ICD-10-CM | POA: Diagnosis not present

## 2023-12-10 DIAGNOSIS — K5730 Diverticulosis of large intestine without perforation or abscess without bleeding: Secondary | ICD-10-CM | POA: Diagnosis not present

## 2023-12-10 DIAGNOSIS — K760 Fatty (change of) liver, not elsewhere classified: Secondary | ICD-10-CM | POA: Diagnosis not present

## 2023-12-10 DIAGNOSIS — R0789 Other chest pain: Secondary | ICD-10-CM | POA: Diagnosis not present

## 2023-12-10 DIAGNOSIS — I10 Essential (primary) hypertension: Secondary | ICD-10-CM | POA: Insufficient documentation

## 2023-12-10 DIAGNOSIS — F17210 Nicotine dependence, cigarettes, uncomplicated: Secondary | ICD-10-CM | POA: Diagnosis not present

## 2023-12-10 LAB — BASIC METABOLIC PANEL WITH GFR
Anion gap: 8 (ref 5–15)
BUN: 14 mg/dL (ref 6–20)
CO2: 24 mmol/L (ref 22–32)
Calcium: 9.4 mg/dL (ref 8.9–10.3)
Chloride: 108 mmol/L (ref 98–111)
Creatinine, Ser: 0.52 mg/dL (ref 0.44–1.00)
GFR, Estimated: >60 mL/min (ref >60)
Glucose, Bld: 105 mg/dL — ABNORMAL HIGH (ref 70–99)
Potassium: 3.2 mmol/L — ABNORMAL LOW (ref 3.5–5.1)
Sodium: 140 mmol/L (ref 135–145)

## 2023-12-10 LAB — I-STAT CHEM 8, ED
BUN: 13 mg/dL (ref 6–20)
Calcium, Ion: 1.21 mmol/L (ref 1.15–1.40)
Chloride: 106 mmol/L (ref 98–111)
Creatinine, Ser: 0.6 mg/dL (ref 0.44–1.00)
Glucose, Bld: 102 mg/dL — ABNORMAL HIGH (ref 70–99)
HCT: 38 % (ref 36.0–46.0)
Hemoglobin: 12.9 g/dL (ref 12.0–15.0)
Potassium: 3.1 mmol/L — ABNORMAL LOW (ref 3.5–5.1)
Sodium: 142 mmol/L (ref 135–145)
TCO2: 24 mmol/L (ref 22–32)

## 2023-12-10 LAB — CBC WITH DIFFERENTIAL/PLATELET
Abs Immature Granulocytes: 0.02 10*3/uL (ref 0.00–0.07)
Basophils Absolute: 0 10*3/uL (ref 0.0–0.1)
Basophils Relative: 0 %
Eosinophils Absolute: 0.1 10*3/uL (ref 0.0–0.5)
Eosinophils Relative: 1 %
HCT: 35.8 % — ABNORMAL LOW (ref 36.0–46.0)
Hemoglobin: 12.1 g/dL (ref 12.0–15.0)
Immature Granulocytes: 0 %
Lymphocytes Relative: 37 %
Lymphs Abs: 2.8 10*3/uL (ref 0.7–4.0)
MCH: 28.8 pg (ref 26.0–34.0)
MCHC: 33.8 g/dL (ref 30.0–36.0)
MCV: 85.2 fL (ref 80.0–100.0)
Monocytes Absolute: 0.6 10*3/uL (ref 0.1–1.0)
Monocytes Relative: 7 %
Neutro Abs: 4.2 10*3/uL (ref 1.7–7.7)
Neutrophils Relative %: 55 %
Platelets: 340 10*3/uL (ref 150–400)
RBC: 4.2 MIL/uL (ref 3.87–5.11)
RDW: 16.4 % — ABNORMAL HIGH (ref 11.5–15.5)
WBC: 7.8 10*3/uL (ref 4.0–10.5)
nRBC: 0 % (ref 0.0–0.2)

## 2023-12-10 MED ORDER — IOHEXOL 300 MG/ML  SOLN
100.0000 mL | Freq: Once | INTRAMUSCULAR | Status: AC | PRN
  Administered 2023-12-10: 100 mL via INTRAVENOUS

## 2023-12-10 MED ORDER — OXYCODONE-ACETAMINOPHEN 5-325 MG PO TABS
1.0000 | ORAL_TABLET | Freq: Once | ORAL | Status: AC
  Administered 2023-12-10: 1 via ORAL
  Filled 2023-12-10: qty 1
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
## 2023-12-11 NOTE — Discharge Instructions (Addendum)
 For pain control you may take 1000 mg of acetaminophen (Tylenol) every 8 hours and/or 600 mg of Ibuprofen (Motrin, Advil, etc.) every 6-8 hours as needed.  Please limit acetaminophen (Tylenol) to 4000 mg and Ibuprofen (Motrin, Advil, etc.) to 2400 mg for a 24hr period. Please note that other over-the-counter medicine may contain acetaminophen or ibuprofen as a component of their ingredients.

## 2024-01-04 ENCOUNTER — Other Ambulatory Visit: Payer: Self-pay | Admitting: Physician Assistant

## 2024-02-10 ENCOUNTER — Other Ambulatory Visit: Payer: Self-pay | Admitting: Family

## 2024-02-10 DIAGNOSIS — I10 Essential (primary) hypertension: Secondary | ICD-10-CM

## 2024-02-24 ENCOUNTER — Telehealth: Payer: Self-pay | Admitting: Physician Assistant

## 2024-02-24 NOTE — Telephone Encounter (Signed)
 Patient called. She would like meloxicam  called in for her.

## 2024-02-25 MED ORDER — MELOXICAM 7.5 MG PO TABS: 7.5000 mg | ORAL_TABLET | Freq: Every day | ORAL | 0 refills | Status: DC

## 2024-03-20 ENCOUNTER — Other Ambulatory Visit: Payer: Self-pay | Admitting: Physician Assistant

## 2024-04-21 ENCOUNTER — Other Ambulatory Visit: Payer: Self-pay | Admitting: Physician Assistant

## 2024-06-05 ENCOUNTER — Encounter: Admitting: Family

## 2024-06-07 NOTE — Progress Notes (Signed)
   This encounter was created in error - please disregard. No show

## 2024-06-12 ENCOUNTER — Encounter: Payer: Self-pay | Admitting: Radiology

## 2024-08-09 ENCOUNTER — Other Ambulatory Visit: Payer: Self-pay | Admitting: Family

## 2024-08-09 DIAGNOSIS — I10 Essential (primary) hypertension: Secondary | ICD-10-CM

## 2024-08-14 ENCOUNTER — Other Ambulatory Visit: Payer: Self-pay | Admitting: Physician Assistant

## 2024-08-15 ENCOUNTER — Telehealth: Payer: Self-pay | Admitting: Physician Assistant

## 2024-08-15 NOTE — Telephone Encounter (Signed)
 Pt called saying that they want the Meloxicam  refilled. Pharmacy is [facility] on [address]. Call back number is [phone]

## 2024-08-16 ENCOUNTER — Other Ambulatory Visit: Payer: Self-pay | Admitting: Physician Assistant

## 2024-08-16 MED ORDER — MELOXICAM 7.5 MG PO TABS: 7.5000 mg | ORAL_TABLET | Freq: Every day | ORAL | 0 refills | Status: AC

## 2024-08-16 NOTE — Telephone Encounter (Signed)
 Patient is aware.

## 2024-09-07 ENCOUNTER — Other Ambulatory Visit: Payer: Self-pay | Admitting: Family

## 2024-09-07 DIAGNOSIS — I10 Essential (primary) hypertension: Secondary | ICD-10-CM

## 2024-09-09 ENCOUNTER — Other Ambulatory Visit: Payer: Self-pay | Admitting: Physician Assistant

## 2024-09-12 ENCOUNTER — Encounter: Payer: Self-pay | Admitting: Family

## 2024-09-12 ENCOUNTER — Ambulatory Visit: Admitting: Family

## 2024-09-12 VITALS — BP 134/78 | HR 73 | Temp 97.5°F | Ht 62.0 in | Wt 156.0 lb

## 2024-09-12 DIAGNOSIS — N951 Menopausal and female climacteric states: Secondary | ICD-10-CM | POA: Diagnosis not present

## 2024-09-12 DIAGNOSIS — E7800 Pure hypercholesterolemia, unspecified: Secondary | ICD-10-CM | POA: Diagnosis not present

## 2024-09-12 DIAGNOSIS — I10 Essential (primary) hypertension: Secondary | ICD-10-CM

## 2024-09-12 DIAGNOSIS — F17209 Nicotine dependence, unspecified, with unspecified nicotine-induced disorders: Secondary | ICD-10-CM

## 2024-09-12 MED ORDER — AMLODIPINE BESYLATE 10 MG PO TABS: 10.0000 mg | ORAL_TABLET | Freq: Every day | ORAL | 1 refills | Status: AC
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
## 2024-09-13 ENCOUNTER — Other Ambulatory Visit: Payer: Self-pay | Admitting: Physician Assistant

## 2024-09-13 LAB — CBC WITH DIFFERENTIAL/PLATELET
Absolute Lymphocytes: 3856 {cells}/uL (ref 850–3900)
Absolute Monocytes: 624 {cells}/uL (ref 200–950)
Basophils Absolute: 32 {cells}/uL (ref 0–200)
Basophils Relative: 0.4 %
Eosinophils Absolute: 80 {cells}/uL (ref 15–500)
Eosinophils Relative: 1 %
HCT: 37.1 % (ref 35.9–46.0)
Hemoglobin: 12.5 g/dL (ref 11.7–15.5)
MCH: 28.5 pg (ref 27.0–33.0)
MCHC: 33.7 g/dL (ref 31.6–35.4)
MCV: 84.5 fL (ref 81.4–101.7)
MPV: 10.5 fL (ref 7.5–12.5)
Monocytes Relative: 7.8 %
Neutro Abs: 3408 {cells}/uL (ref 1500–7800)
Neutrophils Relative %: 42.6 %
Platelets: 386 10*3/uL (ref 140–400)
RBC: 4.39 Million/uL (ref 3.80–5.10)
RDW: 15.9 % — ABNORMAL HIGH (ref 11.0–15.0)
Total Lymphocyte: 48.2 %
WBC: 8 10*3/uL (ref 3.8–10.8)

## 2024-09-13 LAB — COMPREHENSIVE METABOLIC PANEL WITH GFR
AG Ratio: 1.9 (calc) (ref 1.0–2.5)
ALT: 15 U/L (ref 6–29)
AST: 14 U/L (ref 10–35)
Albumin: 4.7 g/dL (ref 3.6–5.1)
Alkaline phosphatase (APISO): 89 U/L (ref 37–153)
BUN/Creatinine Ratio: 24 (calc) — ABNORMAL HIGH (ref 6–22)
BUN: 11 mg/dL (ref 7–25)
CO2: 28 mmol/L (ref 20–32)
Calcium: 10 mg/dL (ref 8.6–10.4)
Chloride: 105 mmol/L (ref 98–110)
Creat: 0.46 mg/dL — ABNORMAL LOW (ref 0.50–1.03)
Globulin: 2.5 g/dL (ref 1.9–3.7)
Glucose, Bld: 104 mg/dL — ABNORMAL HIGH (ref 65–99)
Potassium: 3.4 mmol/L — ABNORMAL LOW (ref 3.5–5.3)
Sodium: 142 mmol/L (ref 135–146)
Total Bilirubin: 0.4 mg/dL (ref 0.2–1.2)
Total Protein: 7.2 g/dL (ref 6.1–8.1)
eGFR: 114 mL/min/{1.73_m2}

## 2024-09-13 LAB — LIPID PANEL
Cholesterol: 197 mg/dL
HDL: 59 mg/dL
LDL Cholesterol (Calc): 110 mg/dL — ABNORMAL HIGH
Non-HDL Cholesterol (Calc): 138 mg/dL — ABNORMAL HIGH
Total CHOL/HDL Ratio: 3.3 (calc)
Triglycerides: 162 mg/dL — ABNORMAL HIGH

## 2024-09-13 LAB — TSH: TSH: 1.9 m[IU]/L

## 2025-03-12 ENCOUNTER — Ambulatory Visit: Admitting: Family
